# Patient Record
Sex: Female | Born: 1975
Health system: Southern US, Community
[De-identification: ages and names within clinical notes are randomized; demographics above are authoritative.]

## PROBLEM LIST (undated history)

## (undated) ENCOUNTER — Ambulatory Visit: Admission: RE | Payer: 59 | Source: Ambulatory Visit

## (undated) DIAGNOSIS — D649 Anemia, unspecified: Secondary | ICD-10-CM

## (undated) DIAGNOSIS — R51 Headache: Secondary | ICD-10-CM

## (undated) DIAGNOSIS — R519 Headache, unspecified: Secondary | ICD-10-CM

## (undated) DIAGNOSIS — I499 Cardiac arrhythmia, unspecified: Secondary | ICD-10-CM

---

## 2005-07-28 ENCOUNTER — Emergency Department: Payer: Self-pay | Admitting: Unknown Physician Specialty

## 2014-09-04 ENCOUNTER — Emergency Department: Payer: Self-pay | Admitting: Emergency Medicine

## 2014-09-04 LAB — CBC
HCT: 42.3 % (ref 35.0–47.0)
HGB: 14.1 g/dL (ref 12.0–16.0)
MCH: 29.1 pg (ref 26.0–34.0)
MCHC: 33.4 g/dL (ref 32.0–36.0)
MCV: 87 fL (ref 80–100)
Platelet: 287 10*3/uL (ref 150–440)
RBC: 4.86 10*6/uL (ref 3.80–5.20)
RDW: 12.9 % (ref 11.5–14.5)
WBC: 11.4 10*3/uL — AB (ref 3.6–11.0)

## 2014-09-04 LAB — HCG, QUANTITATIVE, PREGNANCY: Beta Hcg, Quant.: 41425 m[IU]/mL — ABNORMAL HIGH

## 2014-10-24 ENCOUNTER — Emergency Department: Payer: Self-pay | Admitting: Emergency Medicine

## 2014-11-25 ENCOUNTER — Emergency Department
Admit: 2014-11-25 | Disposition: A | Discharge status: Home / Self Care | Payer: Self-pay | Admitting: Emergency Medicine

## 2014-11-26 LAB — URINALYSIS, COMPLETE
Bilirubin,UR: NEGATIVE
Glucose,UR: NEGATIVE mg/dL (ref 0–75)
Ketone: NEGATIVE
NITRITE: NEGATIVE
Ph: 6 (ref 4.5–8.0)
Protein: 30
SPECIFIC GRAVITY: 1.023 (ref 1.003–1.030)

## 2014-12-16 ENCOUNTER — Other Ambulatory Visit: Payer: Self-pay | Admitting: Obstetrics and Gynecology

## 2014-12-16 DIAGNOSIS — IMO0002 Reserved for concepts with insufficient information to code with codable children: Secondary | ICD-10-CM

## 2014-12-16 DIAGNOSIS — Z0489 Encounter for examination and observation for other specified reasons: Secondary | ICD-10-CM

## 2015-01-05 ENCOUNTER — Ambulatory Visit
Admission: RE | Admit: 2015-01-05 | Discharge: 2015-01-05 | Disposition: A | Discharge status: Home / Self Care | Payer: 59 | Source: Ambulatory Visit | Attending: Obstetrics and Gynecology | Admitting: Obstetrics and Gynecology

## 2015-01-05 ENCOUNTER — Ambulatory Visit: Payer: 59

## 2015-01-05 DIAGNOSIS — Z0489 Encounter for examination and observation for other specified reasons: Secondary | ICD-10-CM

## 2015-01-05 DIAGNOSIS — IMO0002 Reserved for concepts with insufficient information to code with codable children: Secondary | ICD-10-CM

## 2015-01-05 DIAGNOSIS — O288 Other abnormal findings on antenatal screening of mother: Secondary | ICD-10-CM | POA: Diagnosis present

## 2015-01-05 LAB — US OB DETAIL + 14 WK

## 2015-04-03 ENCOUNTER — Observation Stay
Admission: RE | Admit: 2015-04-03 | Discharge: 2015-04-03 | Disposition: A | Discharge status: Home / Self Care | Payer: 59 | Source: Intra-hospital | Attending: Obstetrics & Gynecology | Admitting: Obstetrics & Gynecology

## 2015-04-03 DIAGNOSIS — Z3A37 37 weeks gestation of pregnancy: Secondary | ICD-10-CM | POA: Diagnosis not present

## 2015-04-03 DIAGNOSIS — O288 Other abnormal findings on antenatal screening of mother: Secondary | ICD-10-CM | POA: Diagnosis present

## 2015-04-03 NOTE — Discharge Instructions (Signed)
Return to ER for decrease in fetal movement, contractions 5 minutes apart for 2 hours, or any leaking fluid, or heavy vaginal bleeding.

## 2015-04-03 NOTE — Discharge Summary (Signed)
Obstetric History and Physical  Dawn Terry is a 39 y.o. G2P1 with Estimated Date of Delivery: 04/21/15 per 8 wk [place] who presents at [redacted]w[redacted]d  presenting for prolonged monitoring after NR NST in office today with decels. AFI 8 cm in office today.  Patient states she has been having rare contractions, no vaginal bleeding, intact membranes, with active fetal movement.    Prenatal Course Source of Care: [facility]  with onset of care at 7 weeks Pregnancy complications or risks: BMI >40, AMA with negative Informaseq, elevated 1 hr with normal 3 hr at 28 wks, Rh neg (rhogam given 01/30/15), fetal renal pyelectasis resolved on f/u   Prenatal labs and studies: ABO, Rh: A neg  Antibody: neg Rubella: Immune Varicella: Immune RPR:  Non-reactive HBsAg:  neg HIV: neg GC/CT: neg/neg GBS: neg  1 hr Glucola: 150 at 28 wks, 3 hr with only 1 elevated value   Genetic screening: Negative Informaseq XY    Prenatal Transfer Tool   PMHx: obesity, high cholesterol  No past surgical history on file.  OB History  Gravida Para Term Preterm AB SAB TAB Ectopic Multiple Living  2 1            # Outcome Date GA Lbr Len/2nd Weight Sex Delivery Anes PTL Lv  2 Current           1 Para 05/1995              Social History   Social History  . Marital Status: Married    Spouse Name: N/A  . Number of Children: N/A  . Years of Education: N/A   Social History Main Topics  . Smoking status: Not on file  . Smokeless tobacco: Not on file  . Alcohol Use: Not on file  . Drug Use: Not on file  . Sexual Activity: Not on file   Other Topics Concern  . Not on file   Social History Narrative  . No narrative on file    No family history on file.  Prescriptions prior to admission  Medication Sig Dispense Refill Last Dose   Zyrtec - prn      . Prenatal Vit-Fe Fumarate-FA (PRENATAL MULTIVITAMIN) TABS tablet Take 1 tablet by mouth daily at 12 noon.   Taking    No Known Allergies  Review of Systems:  Negative except for what is mentioned in HPI.  Physical Exam: There were no vitals taken for this visit. GENERAL: Well-developed, well-nourished female in no acute distress.  ABDOMEN: Soft, nontender, nondistended, gravid. EXTREMITIES: Nontender, no edema Cervical Exam: Dilatation FT cm   Effacement 75 %   Station -2 to -3   Presentation: cephalic FHT: Category: 1 Baseline rate 130 bpm   Variability moderate  Accelerations present   Decelerations variable - rare Contractions: irregular    Pertinent Labs/Studies:   No results found for this or any previous visit (from the past 24 hour(s)).  Assessment : IUP at [redacted]w[redacted]d with category 1 FHR tracing  Plan: Discharge Home  Labor precautions F/u in office next week for AFI and NST for BMI >40 protocol

## 2015-04-28 ENCOUNTER — Inpatient Hospital Stay
Admission: EM | Admit: 2015-04-28 | Discharge: 2015-05-02 | DRG: 765 | Disposition: A | Discharge status: Home / Self Care | Payer: 59 | Attending: Obstetrics and Gynecology | Admitting: Obstetrics and Gynecology

## 2015-04-28 ENCOUNTER — Encounter: Payer: Self-pay | Admitting: *Deleted

## 2015-04-28 DIAGNOSIS — O480 Post-term pregnancy: Secondary | ICD-10-CM | POA: Diagnosis present

## 2015-04-28 DIAGNOSIS — O99214 Obesity complicating childbirth: Secondary | ICD-10-CM | POA: Diagnosis present

## 2015-04-28 DIAGNOSIS — Z6841 Body Mass Index (BMI) 40.0 and over, adult: Secondary | ICD-10-CM | POA: Diagnosis not present

## 2015-04-28 DIAGNOSIS — O09523 Supervision of elderly multigravida, third trimester: Secondary | ICD-10-CM | POA: Diagnosis not present

## 2015-04-28 DIAGNOSIS — Z3A41 41 weeks gestation of pregnancy: Secondary | ICD-10-CM | POA: Diagnosis present

## 2015-04-28 DIAGNOSIS — O9902 Anemia complicating childbirth: Secondary | ICD-10-CM | POA: Diagnosis present

## 2015-04-28 LAB — OB RESULTS CONSOLE VARICELLA ZOSTER ANTIBODY, IGG: Varicella: IMMUNE

## 2015-04-28 LAB — OB RESULTS CONSOLE RPR: RPR: NONREACTIVE

## 2015-04-28 LAB — OB RESULTS CONSOLE HEPATITIS B SURFACE ANTIGEN: Hepatitis B Surface Ag: NEGATIVE

## 2015-04-28 LAB — OB RESULTS CONSOLE GBS: GBS: NEGATIVE

## 2015-04-28 LAB — OB RESULTS CONSOLE RUBELLA ANTIBODY, IGM: RUBELLA: IMMUNE

## 2015-04-28 LAB — OB RESULTS CONSOLE ABO/RH: RH TYPE: NEGATIVE

## 2015-04-28 LAB — OB RESULTS CONSOLE HIV ANTIBODY (ROUTINE TESTING): HIV: NONREACTIVE

## 2015-04-28 MED ORDER — LACTATED RINGERS IV SOLN
INTRAVENOUS | Status: DC
  Administered 2015-04-29 (×3): via INTRAVENOUS

## 2015-04-28 MED ORDER — TERBUTALINE SULFATE 1 MG/ML IJ SOLN: 0.2500 mg | Freq: Once | INTRAMUSCULAR | Status: DC | PRN

## 2015-04-28 MED ORDER — OXYTOCIN BOLUS FROM INFUSION: 500.0000 mL | INTRAVENOUS | Status: DC

## 2015-04-28 MED ORDER — OXYTOCIN 40 UNITS IN LACTATED RINGERS INFUSION - SIMPLE MED
62.5000 mL/h | INTRAVENOUS | Status: DC
  Filled 2015-04-28: qty 1000

## 2015-04-28 MED ORDER — ACETAMINOPHEN 325 MG PO TABS: 650.0000 mg | ORAL_TABLET | ORAL | Status: DC | PRN

## 2015-04-28 MED ORDER — LACTATED RINGERS IV SOLN: 500.0000 mL | INTRAVENOUS | Status: DC | PRN

## 2015-04-28 MED ORDER — CITRIC ACID-SODIUM CITRATE 334-500 MG/5ML PO SOLN
30.0000 mL | ORAL | Status: DC | PRN
  Filled 2015-04-28: qty 30

## 2015-04-28 MED ORDER — LIDOCAINE HCL (PF) 1 % IJ SOLN
30.0000 mL | INTRAMUSCULAR | Status: DC | PRN
  Filled 2015-04-28: qty 30

## 2015-04-28 MED ORDER — DINOPROSTONE 10 MG VA INST
10.0000 mg | VAGINAL_INSERT | Freq: Once | VAGINAL | Status: DC
  Filled 2015-04-28: qty 1

## 2015-04-28 MED ORDER — ONDANSETRON HCL 4 MG/2ML IJ SOLN: 4.0000 mg | Freq: Four times a day (QID) | INTRAMUSCULAR | Status: DC | PRN

## 2015-04-28 MED ORDER — ZOLPIDEM TARTRATE 5 MG PO TABS: 5.0000 mg | ORAL_TABLET | Freq: Every evening | ORAL | Status: DC | PRN

## 2015-04-28 MED ORDER — FENTANYL CITRATE (PF) 100 MCG/2ML IJ SOLN: 50.0000 ug | INTRAMUSCULAR | Status: DC | PRN

## 2015-04-29 ENCOUNTER — Inpatient Hospital Stay: Payer: 59 | Admitting: Anesthesiology

## 2015-04-29 ENCOUNTER — Encounter
Admission: EM | Disposition: A | Discharge status: Home / Self Care | Payer: Self-pay | Source: Home / Self Care | Attending: Obstetrics and Gynecology

## 2015-04-29 LAB — CBC
HCT: 30.7 % — ABNORMAL LOW (ref 35.0–47.0)
HEMATOCRIT: 36.1 % (ref 35.0–47.0)
HEMOGLOBIN: 10.2 g/dL — AB (ref 12.0–16.0)
HEMOGLOBIN: 12 g/dL (ref 12.0–16.0)
MCH: 27.9 pg (ref 26.0–34.0)
MCH: 28.1 pg (ref 26.0–34.0)
MCHC: 33 g/dL (ref 32.0–36.0)
MCHC: 33.2 g/dL (ref 32.0–36.0)
MCV: 85 fL (ref 80.0–100.0)
MCV: 83.9 fL (ref 80.0–100.0)
Platelets: 229 10*3/uL (ref 150–440)
Platelets: 270 10*3/uL (ref 150–440)
RBC: 3.62 MIL/uL — ABNORMAL LOW (ref 3.80–5.20)
RBC: 4.31 MIL/uL (ref 3.80–5.20)
RDW: 13.4 % (ref 11.5–14.5)
RDW: 13.5 % (ref 11.5–14.5)
WBC: 11.6 10*3/uL — ABNORMAL HIGH (ref 3.6–11.0)
WBC: 18.3 10*3/uL — ABNORMAL HIGH (ref 3.6–11.0)

## 2015-04-29 LAB — ABO/RH: ABO/RH(D): A NEG

## 2015-04-29 LAB — PREPARE RBC (CROSSMATCH)

## 2015-04-29 SURGERY — Surgical Case
Anesthesia: Spinal

## 2015-04-29 MED ORDER — ONDANSETRON HCL 4 MG/2ML IJ SOLN: 4.0000 mg | Freq: Three times a day (TID) | INTRAMUSCULAR | Status: DC | PRN

## 2015-04-29 MED ORDER — NALBUPHINE HCL 10 MG/ML IJ SOLN
5.0000 mg | Freq: Once | INTRAMUSCULAR | Status: DC | PRN
  Filled 2015-04-29: qty 0.5

## 2015-04-29 MED ORDER — FENTANYL CITRATE (PF) 100 MCG/2ML IJ SOLN: INTRAMUSCULAR | Status: DC | PRN

## 2015-04-29 MED ORDER — CEFAZOLIN SODIUM-DEXTROSE 2-3 GM-% IV SOLR
INTRAVENOUS | Status: AC
  Administered 2015-04-29: 2 g via INTRAVENOUS
  Administered 2015-04-29: 1 g via INTRAVENOUS
  Filled 2015-04-29: qty 50

## 2015-04-29 MED ORDER — PRENATAL MULTIVITAMIN CH
1.0000 | ORAL_TABLET | Freq: Every day | ORAL | Status: DC
  Administered 2015-04-30 – 2015-05-02 (×3): 1 via ORAL
  Filled 2015-04-29 (×3): qty 1

## 2015-04-29 MED ORDER — IBUPROFEN 600 MG PO TABS: 600.0000 mg | ORAL_TABLET | Freq: Four times a day (QID) | ORAL | Status: DC | PRN

## 2015-04-29 MED ORDER — DEXTROSE 5 % IV SOLN
3.0000 g | INTRAVENOUS | Status: DC
  Filled 2015-04-29: qty 3000

## 2015-04-29 MED ORDER — SODIUM CHLORIDE 0.9 % IJ SOLN
INTRAMUSCULAR | Status: AC
  Filled 2015-04-29: qty 6

## 2015-04-29 MED ORDER — MEPERIDINE HCL 25 MG/ML IJ SOLN: 6.2500 mg | INTRAMUSCULAR | Status: DC | PRN

## 2015-04-29 MED ORDER — NALOXONE HCL 0.4 MG/ML IJ SOLN: 0.4000 mg | INTRAMUSCULAR | Status: DC | PRN

## 2015-04-29 MED ORDER — SIMETHICONE 80 MG PO CHEW
80.0000 mg | CHEWABLE_TABLET | Freq: Three times a day (TID) | ORAL | Status: DC
  Administered 2015-04-30 – 2015-05-02 (×6): 80 mg via ORAL
  Filled 2015-04-29 (×6): qty 1

## 2015-04-29 MED ORDER — BUPIVACAINE HCL (PF) 0.75 % IJ SOLN
INTRAMUSCULAR | Status: DC | PRN
  Administered 2015-04-29: 1.5 mL via INTRATHECAL

## 2015-04-29 MED ORDER — BUPIVACAINE HCL (PF) 0.5 % IJ SOLN
INTRAMUSCULAR | Status: AC
  Filled 2015-04-29: qty 30

## 2015-04-29 MED ORDER — OXYTOCIN 40 UNITS IN LACTATED RINGERS INFUSION - SIMPLE MED
INTRAVENOUS | Status: AC
  Administered 2015-04-29: 500 mL via INTRAVENOUS
  Filled 2015-04-29: qty 1000

## 2015-04-29 MED ORDER — SODIUM CHLORIDE 0.9 % IV SOLN: Freq: Once | INTRAVENOUS | Status: DC

## 2015-04-29 MED ORDER — LACTATED RINGERS IV SOLN
INTRAVENOUS | Status: DC | PRN
  Administered 2015-04-29 (×2): via INTRAVENOUS

## 2015-04-29 MED ORDER — SODIUM CHLORIDE 0.9 % IJ SOLN: 3.0000 mL | INTRAMUSCULAR | Status: DC | PRN

## 2015-04-29 MED ORDER — FENTANYL CITRATE (PF) 100 MCG/2ML IJ SOLN: 25.0000 ug | INTRAMUSCULAR | Status: DC | PRN

## 2015-04-29 MED ORDER — CITRIC ACID-SODIUM CITRATE 334-500 MG/5ML PO SOLN
30.0000 mL | ORAL | Status: AC
  Administered 2015-04-29: 30 mL via ORAL

## 2015-04-29 MED ORDER — DIPHENHYDRAMINE HCL 25 MG PO CAPS: 25.0000 mg | ORAL_CAPSULE | ORAL | Status: DC | PRN

## 2015-04-29 MED ORDER — FERROUS SULFATE 325 (65 FE) MG PO TABS
325.0000 mg | ORAL_TABLET | Freq: Two times a day (BID) | ORAL | Status: DC
  Administered 2015-04-30 – 2015-05-02 (×5): 325 mg via ORAL
  Filled 2015-04-29 (×5): qty 1

## 2015-04-29 MED ORDER — NALOXONE HCL 1 MG/ML IJ SOLN
1.0000 ug/kg/h | INTRAVENOUS | Status: DC | PRN
  Filled 2015-04-29: qty 2

## 2015-04-29 MED ORDER — LACTATED RINGERS IV SOLN
INTRAVENOUS | Status: DC
  Administered 2015-04-30 (×3): via INTRAVENOUS

## 2015-04-29 MED ORDER — LANOLIN HYDROUS EX OINT: 1.0000 "application " | TOPICAL_OINTMENT | CUTANEOUS | Status: DC | PRN

## 2015-04-29 MED ORDER — OXYTOCIN 40 UNITS IN LACTATED RINGERS INFUSION - SIMPLE MED: 62.5000 mL/h | INTRAVENOUS | Status: AC

## 2015-04-29 MED ORDER — TERBUTALINE SULFATE 1 MG/ML IJ SOLN: 0.2500 mg | Freq: Once | INTRAMUSCULAR | Status: DC | PRN

## 2015-04-29 MED ORDER — OXYTOCIN 40 UNITS IN LACTATED RINGERS INFUSION - SIMPLE MED
62.5000 mL/h | INTRAVENOUS | Status: DC
  Administered 2015-04-29: 1.5 mL/h via INTRAVENOUS

## 2015-04-29 MED ORDER — BUPIVACAINE ON-Q PAIN PUMP (FOR ORDER SET NO CHG)
INJECTION | Status: DC
Start: 2015-04-29 — End: 2015-05-02
  Filled 2015-04-29: qty 1

## 2015-04-29 MED ORDER — CEFAZOLIN SODIUM 1-5 GM-% IV SOLN
INTRAVENOUS | Status: AC
Start: 2015-04-29 — End: 2015-04-30
  Filled 2015-04-29: qty 50

## 2015-04-29 MED ORDER — DIPHENHYDRAMINE HCL 25 MG PO CAPS: 25.0000 mg | ORAL_CAPSULE | Freq: Four times a day (QID) | ORAL | Status: DC | PRN

## 2015-04-29 MED ORDER — BUPIVACAINE HCL 0.5 % IJ SOLN: 5.0000 mL | Freq: Once | INTRAMUSCULAR | Status: DC

## 2015-04-29 MED ORDER — MENTHOL 3 MG MT LOZG
1.0000 | LOZENGE | OROMUCOSAL | Status: DC | PRN
  Filled 2015-04-29: qty 9

## 2015-04-29 MED ORDER — EPHEDRINE SULFATE 50 MG/ML IJ SOLN
INTRAMUSCULAR | Status: DC | PRN
  Administered 2015-04-29 (×9): 10 mg via INTRAVENOUS

## 2015-04-29 MED ORDER — NALBUPHINE HCL 10 MG/ML IJ SOLN
5.0000 mg | INTRAMUSCULAR | Status: DC | PRN
  Filled 2015-04-29: qty 0.5

## 2015-04-29 MED ORDER — BUPIVACAINE 0.25 % ON-Q PUMP DUAL CATH 400 ML
400.0000 mL | INJECTION | Status: DC
  Filled 2015-04-29: qty 400

## 2015-04-29 MED ORDER — SCOPOLAMINE 1 MG/3DAYS TD PT72
1.0000 | MEDICATED_PATCH | Freq: Once | TRANSDERMAL | Status: DC
  Filled 2015-04-29: qty 1

## 2015-04-29 MED ORDER — WITCH HAZEL-GLYCERIN EX PADS: 1.0000 "application " | MEDICATED_PAD | CUTANEOUS | Status: DC | PRN

## 2015-04-29 MED ORDER — SENNOSIDES-DOCUSATE SODIUM 8.6-50 MG PO TABS
2.0000 | ORAL_TABLET | ORAL | Status: DC
Start: 2015-04-30 — End: 2015-05-02
  Administered 2015-04-30 – 2015-05-01 (×2): 2 via ORAL
  Filled 2015-04-29 (×2): qty 2

## 2015-04-29 MED ORDER — ONDANSETRON HCL 4 MG/2ML IJ SOLN
4.0000 mg | Freq: Once | INTRAMUSCULAR | Status: AC | PRN
  Administered 2015-04-29: 4 mg via INTRAVENOUS

## 2015-04-29 MED ORDER — DIPHENHYDRAMINE HCL 50 MG/ML IJ SOLN: 12.5000 mg | INTRAMUSCULAR | Status: DC | PRN

## 2015-04-29 MED ORDER — DIBUCAINE 1 % RE OINT: 1.0000 "application " | TOPICAL_OINTMENT | RECTAL | Status: DC | PRN

## 2015-04-29 MED ORDER — SODIUM CHLORIDE 0.9 % IV SOLN
10.0000 mL/h | Freq: Once | INTRAVENOUS | Status: AC
  Administered 2015-04-29: 17:00:00 via INTRAVENOUS

## 2015-04-29 SURGICAL SUPPLY — 27 items
CANISTER SUCT 3000ML (MISCELLANEOUS) ×3 IMPLANT
CATH KIT ON-Q SILVERSOAK 5IN (CATHETERS) ×6 IMPLANT
CLOSURE WOUND 1/2 X4 (GAUZE/BANDAGES/DRESSINGS) ×1
DRSG TELFA 3X8 NADH (GAUZE/BANDAGES/DRESSINGS) ×3 IMPLANT
ELECT CAUTERY BLADE 6.4 (BLADE) ×3 IMPLANT
GAUZE SPONGE 4X4 12PLY STRL (GAUZE/BANDAGES/DRESSINGS) ×3 IMPLANT
GLOVE BIO SURGEON STRL SZ7 (GLOVE) ×3 IMPLANT
GLOVE INDICATOR 7.5 STRL GRN (GLOVE) ×3 IMPLANT
GOWN STRL REUS W/ TWL LRG LVL3 (GOWN DISPOSABLE) ×3 IMPLANT
GOWN STRL REUS W/TWL LRG LVL3 (GOWN DISPOSABLE) ×6
LIQUID BAND (GAUZE/BANDAGES/DRESSINGS) ×3 IMPLANT
NS IRRIG 1000ML POUR BTL (IV SOLUTION) ×3 IMPLANT
PACK C SECTION AR (MISCELLANEOUS) ×3 IMPLANT
PAD GROUND ADULT SPLIT (MISCELLANEOUS) ×3 IMPLANT
PAD OB MATERNITY 4.3X12.25 (PERSONAL CARE ITEMS) ×6 IMPLANT
PAD PREP 24X41 OB/GYN DISP (PERSONAL CARE ITEMS) ×3 IMPLANT
SPONGE LAP 18X18 5 PK (GAUZE/BANDAGES/DRESSINGS) ×6 IMPLANT
STRIP CLOSURE SKIN 1/2X4 (GAUZE/BANDAGES/DRESSINGS) ×2 IMPLANT
SUT CHROMIC GUT BROWN 0 54 (SUTURE) ×1 IMPLANT
SUT CHROMIC GUT BROWN 0 54IN (SUTURE) ×3
SUT MNCRL 4-0 (SUTURE) ×2
SUT MNCRL 4-0 27XMFL (SUTURE) ×1
SUT PDS AB 1 TP1 96 (SUTURE) ×3 IMPLANT
SUT PLAIN 2 0 XLH (SUTURE) ×3 IMPLANT
SUT VIC AB 0 CT1 36 (SUTURE) ×12 IMPLANT
SUTURE MNCRL 4-0 27XMF (SUTURE) ×1 IMPLANT
SWABSTK COMLB BENZOIN TINCTURE (MISCELLANEOUS) ×3 IMPLANT

## 2015-04-29 NOTE — Discharge Summary (Signed)
Obstetrical Discharge Summary  Date of Admission: 04/28/2015 Date of Discharge: 05/02/2015  Primary OB: [facility]  Gestational Age at Delivery: [redacted]w[redacted]d   Antepartum complications: Advanced Maternal Age, Morbid obesity with BMI 43, Rh negative status, bilateral renal pelviectasis (normal at 28 weeks scan) Reason for Admission: postdates induction of labor Date of Delivery:  04/29/2015  Delivered By: Thomasene Mohair, MD Delivery Type: primary cesarean section, low transverse incision Intrapartum complications/course: Patient with episodic decels with minimal-to-no intervention. The patient was contracting spontaneously 3-4 times per minute. After third episodic decel (that lasted for 8 minutes).  Anesthesia: spinal  Placenta: sponatneous Laceration: n/a Episiotomy: none Newborn Data: Live born female  Birth Weight: 8 lb 8.2 oz (3861 g) APGAR: 8, 9  Discharge Physical Exam:  BP 110/60 mmHg  Pulse 81  Temp(Src) 97.9 F (36.6 C) (Oral)  Resp 20  Ht  (1.6 m)  Wt 235 lb (106.595 kg)  BMI 41.64 kg/m2  SpO2 98%  Breastfeeding? Unknown  General: NAD CV: RRR Pulm: CTABL, nl effort ABD: obese, soft, nttp, nd, +BS Lochia: moderate Incision: c/d/i with steri strips in place and on q in place DVT Evaluation: LE non-ttp, no evidence of DVT on exam.  HEMOGLOBIN  Date Value Ref Range Status  04/30/2015 9.3* 12.0 - 16.0 g/dL Final   HGB  Date Value Ref Range Status  09/04/2014 14.1 12.0-16.0 g/dL Final   HCT  Date Value Ref Range Status  04/30/2015 27.6* 35.0 - 47.0 % Final  09/04/2014 42.3 35.0-47.0 % Final    Post partum course: uncomplicated Postpartum Procedures: transfusion 1U PRBCs Disposition: stable, discharge to home.  Rh Immune globulin given: not applicable (infant is Rh negative, too) Rubella vaccine given: n/a Tdap vaccine given in AP or PP setting: 02/27/15 Flu vaccine given in AP or PP setting: no  Contraception: not d/w pt  Prenatal Labs:  A NEG /  Rubella Immune / Varicella Immune/  RPR negative / HIV negative / HepBsAg negative / Tdap UTD: Yes/pap 2014 neg / Breast  / Contraception: not d/w pt / Follow up: [facility], 1wk    Plan:  Dawn Terry was discharged to home in good condition. Follow-up appointment at [facility] with Dr Jean Rosenthal in 1 week for incision check   Discharge Medications:   Medication List    STOP taking these medications        aspirin 81 MG tablet      TAKE these medications        docusate sodium 100 MG capsule  Commonly known as:  COLACE  Take 1 capsule (100 mg total) by mouth daily.     ferrous sulfate 325 (65 FE) MG tablet  Take 1 tablet (325 mg total) by mouth daily with breakfast.     HYDROcodone-acetaminophen 5-325 MG per tablet  Commonly known as:  NORCO/VICODIN  Take 1-2 tablets by mouth every 6 (six) hours as needed for severe pain.     ibuprofen 600 MG tablet  Commonly known as:  ADVIL,MOTRIN  Take 1 tablet (600 mg total) by mouth every 6 (six) hours as needed.     prenatal multivitamin Tabs tablet  Take 1 tablet by mouth daily at 12 noon.         Signed: Cornelia Copa MD [facility]  Pager: [phone]

## 2015-04-29 NOTE — Progress Notes (Addendum)
OB note CTSP regarding prolonged decel.  39 y/o G2P1001 @ 41/1 here for PDIOL. Preg c/b BMI 43, AMA, Rh negative  No induction methods started yet and had episodic decel to the 60s for 3-35m at 2300 and now most recently to the 60s for 5-41m (episodic) and she is contracting irregularly and no concurrence with contraction; recovery with lateral positioning and O2 and IVFs going.  pt not endorsing any UCs. Currently 115 baseline, +accels, no decels, moderate variability and toco quiet Filed Vitals:   04/28/15 2226  BP: 119/75  Pulse: 68  Temp: 98.4 F (36.9 C)  TempSrc: Oral  Resp: 18  Weight: 235 lb (106.595 kg)   8/22: 3261gm, EFW 73%, AC>97% 9/19: cephalic, AFI 7.8  Will allow fetus to recover for 1.5 hours. If EFM reassuring, then can do CST. Admit labs pending  Cornelia Copa MD [facility]  Pager: [phone]

## 2015-04-29 NOTE — Plan of Care (Signed)
Problem: Consults Goal: Birthing Suites Patient Information Press F2 to bring up selections list   Pt > [redacted] weeks EGA and Inpatient induction  Problem: Phase I Progression Outcomes Goal: Adequate progression of labor Outcome: Not Met (add Reason) Fetal intolerance of labor

## 2015-04-29 NOTE — Anesthesia Preprocedure Evaluation (Addendum)
Anesthesia Evaluation  Patient identified by MRN, date of birth, ID band Patient awake    Reviewed: Allergy & Precautions, NPO status , Patient's Chart, lab work & pertinent test results  History of Anesthesia Complications Negative for: history of anesthetic complications  Airway Mallampati: III       Dental  (+) Teeth Intact   Pulmonary neg pulmonary ROS,           Cardiovascular negative cardio ROS       Neuro/Psych negative neurological ROS     GI/Hepatic negative GI ROS, Neg liver ROS,   Endo/Other  negative endocrine ROS  Renal/GU negative Renal ROS     Musculoskeletal   Abdominal   Peds  Hematology negative hematology ROS (+)   Anesthesia Other Findings   Reproductive/Obstetrics (+) Pregnancy                             Anesthesia Physical Anesthesia Plan  ASA: II and emergent  Anesthesia Plan: Spinal   Post-op Pain Management: MAC Combined w/ Regional for Post-op pain   Induction:   Airway Management Planned:   Additional Equipment:   Intra-op Plan:   Post-operative Plan:   Informed Consent: I have reviewed the patients History and Physical, chart, labs and discussed the procedure including the risks, benefits and alternatives for the proposed anesthesia with the patient or authorized representative who has indicated his/her understanding and acceptance.     Plan Discussed with:   Anesthesia Plan Comments:        Anesthesia Quick Evaluation

## 2015-04-29 NOTE — Progress Notes (Signed)
Orders placed for start of pitocin while foley bulb still in place.

## 2015-04-29 NOTE — Progress Notes (Signed)
Called to see patient due to 8 minute fetal heart rate deceleration.  This resolved by discontinuation of iv pitocin (dose was 1), and maternal position changes.  Fetal heart rate now back to baseline with moderate variability and accels.   Immediately preceding this episode was an episode of tachysystole.  This has been an intermittent phenomenon.  This is the second prolonged deceleration since admission.  The first occurred without provocation.  The patient had been on pitocin for this episode for about 25 minutes.   Discussed options of proceeding with delivery via cesarean section given that patient is quite remote from delivery and with minimal provocation had this type of deceleration.  We discussed proceeding with induction of labor with understanding of risk of needing emergent/urgent surgery if further decelerations occur. Patient and husband has risks of cesarean section discussed at length.  They have discussed this and wish to proceed with cesarean delivery.  Conard Novak, MD, FACOG 04/29/2015 3:03 PM

## 2015-04-29 NOTE — Anesthesia Procedure Notes (Addendum)
Spinal Patient location during procedure: OR Start time: 04/29/2015 3:32 PM End time: 04/29/2015 3:47 PM Staffing Performed by: anesthesiologist  Preanesthetic Checklist Completed: patient identified, site marked, surgical consent, pre-op evaluation, timeout performed, IV checked, risks and benefits discussed and monitors and equipment checked Spinal Block Patient position: sitting Prep: Betadine Patient monitoring: heart rate, continuous pulse ox, blood pressure and cardiac monitor Approach: midline Location: L4-5 Injection technique: single-shot Needle Needle type: Whitacre and Introducer  Needle gauge: 27 G Needle length: 9 cm Needle insertion depth: 9 cm Assessment Sensory level: T6 Additional Notes Negative paresthesia. Negative blood return. Positive free-flowing CSF. Expiration date of kit checked and confirmed. Patient tolerated procedure well, without complications.

## 2015-04-29 NOTE — Progress Notes (Signed)
L&D Note  04/29/2015 - 3:11 AM  39 y.o. y/o G2P1001 @ 41/1 here for PDIOL. Preg c/b BMI 43, AMA, Rh negative, ?fetal b/l pelviectasis  Ms. Coree Riester is admitted for PDIOL   Subjective:  Not feeling any UCs  Objective:   Filed Vitals:   04/28/15 2226  BP: 119/75  Pulse: 68  Temp: 98.4 F (36.9 C)  TempSrc: Oral  Resp: 18  Weight: 235 lb (106.595 kg)    Current Vital Signs 24h Vital Sign Ranges  T 98.4 F (36.9 C) Temp  Avg: 98.4 F (36.9 C)  Min: 98.4 F (36.9 C)  Max: 98.4 F (36.9 C)  BP 119/75 mmHg BP  Min: 119/75  Max: 119/75  HR 68 Pulse  Avg: 68  Min: 68  Max: 68  RR 18 Resp  Avg: 18  Min: 18  Max: 18  SaO2    98/RA No Data Recorded       24 Hour I/O Current Shift I/O  Time Ins Outs       FHR: 115 baseline, +accels, occasional slight variables vs maternal artifact Toco: irregular UCs Gen: NAD SVE: 2/70/-1 per RN  BSUS: cephalic. Subjectively low normal AFI, FHR 122   Recent Labs Lab 04/29/15 0136  WBC 11.6*  HGB 12.0  HCT 36.1  PLT 270   Medications Current Facility-Administered Medications  Medication Dose Route Frequency Provider Last Rate Last Dose  . acetaminophen (TYLENOL) tablet 650 mg  650 mg Oral Q4H PRN [place] Bing, MD      . citric acid-sodium citrate (ORACIT) solution 30 mL  30 mL Oral Q2H PRN [place] Bing, MD      . dinoprostone (CERVIDIL) vaginal insert 10 mg  10 mg Vaginal Once [place] Bing, MD      . fentaNYL (SUBLIMAZE) injection 50 mcg  50 mcg Intravenous Q1H PRN [place] Bing, MD      . lactated ringers infusion 500-1,000 mL  500-1,000 mL Intravenous PRN [place] Bing, MD      . lactated ringers infusion   Intravenous Continuous [place] Bing, MD 125 mL/hr at 04/29/15 0304    . lidocaine (PF) (XYLOCAINE) 1 % injection 30 mL  30 mL Subcutaneous PRN [place] Bing, MD      . ondansetron (ZOFRAN) injection 4 mg  4 mg Intravenous Q6H PRN [place] Bing, MD      . oxytocin (PITOCIN) IV BOLUS FROM BAG  500 mL  Intravenous Continuous [place] Bing, MD      . oxytocin (PITOCIN) IV infusion 40 units in LR 1000 mL  62.5 mL/hr Intravenous Continuous [place] Bing, MD      . terbutaline (BRETHINE) injection 0.25 mg  0.25 mg Subcutaneous Once PRN [place] Bing, MD      . zolpidem (AMBIEN) tablet 5 mg  5 mg Oral QHS PRN [place] Bing, MD        Assessment & Plan:  Pt stable *IUP: category I tracing and has had good tracing since decel approx 2.5 hours. *IOL: d/w pt and husband regarding utility of CST and they are amenable to plan. I told them that if fetus passes, then would d/c pitocin and start cervidil ripening. *Elevated AC: SD precautions and keep close eye on labor curve *GBS: neg *Analgesia: no needs. Recommend early epidural given body habitus and EFM history *AMA: s/p neg NIPT *Rh neg: cord blood and PRN PP rhogam  Cornelia Copa MD [facility] Pager [phone]

## 2015-04-29 NOTE — Transfer of Care (Signed)
Immediate Anesthesia Transfer of Care Note  Patient: Dawn Terry  Procedure(s) Performed: Procedure(s): CESAREAN SECTION  Patient Location: PACU/Labor and Delivery 3  Anesthesia Type:Spinal  Level of Consciousness: awake, alert  and oriented  Airway & Oxygen Therapy: Patient Spontanous Breathing  Post-op Assessment: Report given to RN and Post -op Vital signs reviewed and stable  Post vital signs: Reviewed and stable  Last Vitals:  Filed Vitals:   04/29/15 1317  BP: 113/71  Pulse: 72  Temp:   Resp: 20    Complications: No apparent anesthesia complications

## 2015-04-29 NOTE — Progress Notes (Signed)
Patient ID: Dawn Terry, female   DOB: 04-Oct-1975, 39 y.o.   MRN: [id number] L&D Note    Subjective:  Feels cramping and pressure with contractions  Objective:   Filed Vitals:   04/28/15 2226 04/29/15 0332 04/29/15 0727 04/29/15 0735  BP: 119/75 121/71  117/78  Pulse: 68 54  61  Temp: 98.4 F (36.9 C) 98.1 F (36.7 C)  97.9 F (36.6 C)  TempSrc: Oral Oral  Axillary  Resp: Height:    (1.6 m)   Weight: 235 lb (106.595 kg)       Current Vital Signs 24h Vital Sign Ranges  T 97.9 F (36.6 C) Temp  Avg: 98.1 F (36.7 C)  Min: 97.9 F (36.6 C)  Max: 98.4 F (36.9 C)  BP 117/78 mmHg BP  Min: 117/78  Max: 121/71  HR 61 Pulse  Avg: 61  Min: 54  Max: 68  RR 20 Resp  Avg: 18.7  Min: 18  Max: 20  SaO2     No Data Recorded      Gen: NAD FHR: 120/mod var/+accels/occasional variable deceleration. She did have a deceleration for 4 minutes to the 70s-80s with spontaneous return to baseline. Toco: Irregular, 5 q 10 min SVE: small-moderate amount of dark red blood on perineum and glove after exam.  Cvx 2/50/-2,  After explaining rationale for continued and active cervical ripening, 16G foley placed into cervix with balloon through internal os.  40mL of sterile water injected into balloon by RN.  Patient tolerated the procedure well.   Medications SCHEDULED MEDICATIONS: None    PRN MEDICATIONS  acetaminophen, citric acid-sodium citrate, fentaNYL (SUBLIMAZE) injection, lactated ringers, lidocaine (PF), ondansetron, terbutaline, zolpidem   Assessment & Plan:  39 y.o. G2P1 at [redacted]w[redacted]d IOL for late term pregnancy  *Labor: foley catheter placed. Will consider starting pitocin if fetus tolerates ctx with foley balloon in place. *Fetal Well-being: Reassuring overall, especially more recently. Discussed decelerations and risk of need for cesarean delivery, if we can not safely get fetus through labor process. *GBS: negative *Analgesia: none currently.  Conard Novak, MD   04/29/2015 9:12 AM  [name] [name]

## 2015-04-29 NOTE — H&P (Signed)
See OB Note  ?b/l fetal renal pelviectasis. Can tell peds at delivery  [place] Bing, Montez Hageman MD [facility]  Pager: [phone]

## 2015-04-29 NOTE — Op Note (Signed)
Cesarean Section Procedure Note   Atira Borello   04/29/2015   Pre-operative Diagnosis: 1) Intrauterine pregnancy at 41w1, 2) fetal intolerence to labor.   Post-operative Diagnosis: 1) Intrauterine pregnancy at 41w1, 2) fetal intolerence to labor.   Procedure: primary low transverse cesarean delivery  Surgeon: Surgeon(s) and Role:    * Conard Novak, MD - Primary   Anesthesia: spinal   Findings:  normal appearing gravid uterus, fallopian tubes, and ovaries   Estimated Blood Loss: 2,000 mL  Total IV Fluids: 2,100 ml crystalloid and 300 mL packed red blood cells (1 unit intraoperatively)  Urine Output: 50 mL  Specimens: None  Complications: intrapartum hemorrhage of about 1,500 mL due to traversing placenta to get to fetus  Disposition: PACU - hemodynamically stable.   Maternal Condition: stable   Baby condition / location:  Couplet care / Skin to Skin  Procedure Details:  The patient was seen in the Holding Room. The risks, benefits, complications, treatment options, and expected outcomes were discussed with the patient. The patient concurred with the proposed plan, giving informed consent. identified as Dawn Terry and the procedure verified as C-Section Delivery. A Time Out was held and the above information confirmed.   After induction of anesthesia, the patient was draped and prepped in the usual sterile manner. A Pfannenstiel incision was made and carried down through the subcutaneous tissue to the fascia. Fascial incision was made and extended transversely. The fascia was separated from the underlying rectus tissue superiorly and inferiorly. The peritoneum was identified and entered. Peritoneal incision was extended longitudinally. The bladder flap was bluntly freed from the lower uterine segment. A low transverse uterine incision was made and there was difficulty getting through or around the placenta.  Within about 45 seconds there was 1,500 mL of blood  in the collection container (no amniotomy at this point).  A clamp was placed at the site of the blood loss and finally I was able to get around the placenta and perform an amniotomy.  At this point the hysterotomy was extended with cranial-caudal tension. Delivered from cephalic presentation was a 3,861 gram Living newborn infant(s) with Apgar scores of 8 at one minute and 9 at five minutes. Cord ph was sent the umbilical cord was clamped and cut cord blood was obtained for evaluation. The placenta was removed Intact and appeared normal. The uterine outline, tubes and ovaries appeared normal}. The uterine incision was closed with running locked sutures of 0 Vicryl.  A second layer of the same suture was thrown in an imbricating fashion.  Hemostasis was assured.  The uterus was retained to the abdomen and the paracolic gutters were cleared of all clots and debris.  The peritoneum was reapproximated with 0 vicryl in a running fashion.  The rectus muscles were inspected and found to be hemostatic.    The On-Q catheter pumps were inserted in accordance with the manufacturer's recommendations.  The catheters were inserted approximately 4cm cephelad to the incision line, approximately 1cm apart, straddling the midline.  They were inserted to a depth of the 4th mark. They were positioned superficial to the rectus abdominus muscles and deep to the rectus fascia.    The fascia was then reapproximated with running sutures of 1-0 PDS, looped. The subcutaneous tissue was reapproximated using 2-0 plain gut such that no greater than 2cm of dead space remained. The subcuticular closure was performed using 4-0 monocryl. The skin closure was reinforced using benzoin and 1/2" steri-strips.  The On-Q  catheters were bolused with 5 mL of 0.5% marcaine plain for a total of 10 mL (in the PACU).  The catheters were affixed to the skin with surgical skin glue, steri-strips, and tegaderm.    Instrument, sponge, and needle counts  were correct prior the abdominal closure and were correct at the conclusion of the case.  The patient received Ancef 3 gram IV prior to skin incision (within 30 minutes). For VTE prophylaxis she was wearing SCDs throughout the case.  Of note, the patient received one unit of packed red cells during the case.  The reasoning for the transfusion was relayed to the patient and her husband during the episode, who agreed.  Cord blood gases were obtained due to the large amount of placental blood loss prior to delivery.  The pH was 7.19.  The neonatologist was present and received the infant immediately after delivery.  Signed: Conard Novak, MD, FACOG 04/29/2015 5:22 PM

## 2015-04-30 LAB — CBC
HEMATOCRIT: 27.6 % — AB (ref 35.0–47.0)
Hemoglobin: 9.3 g/dL — ABNORMAL LOW (ref 12.0–16.0)
MCH: 28.5 pg (ref 26.0–34.0)
MCHC: 33.5 g/dL (ref 32.0–36.0)
MCV: 84.9 fL (ref 80.0–100.0)
Platelets: 217 10*3/uL (ref 150–440)
RBC: 3.25 MIL/uL — ABNORMAL LOW (ref 3.80–5.20)
RDW: 13.4 % (ref 11.5–14.5)
WBC: 16.6 10*3/uL — ABNORMAL HIGH (ref 3.6–11.0)

## 2015-04-30 LAB — RPR: RPR: NONREACTIVE

## 2015-04-30 MED ORDER — HYDROCODONE-ACETAMINOPHEN 5-325 MG PO TABS
1.0000 | ORAL_TABLET | ORAL | Status: DC | PRN
  Administered 2015-04-30 (×3): 2 via ORAL
  Administered 2015-05-01: 1 via ORAL
  Administered 2015-05-01: 2 via ORAL
  Administered 2015-05-01 – 2015-05-02 (×2): 1 via ORAL
  Filled 2015-04-30: qty 2
  Filled 2015-04-30: qty 1
  Filled 2015-04-30 (×2): qty 2
  Filled 2015-04-30: qty 1
  Filled 2015-04-30: qty 2
  Filled 2015-04-30: qty 1

## 2015-04-30 MED ORDER — ONDANSETRON HCL 4 MG/2ML IJ SOLN
4.0000 mg | Freq: Four times a day (QID) | INTRAMUSCULAR | Status: DC | PRN
  Administered 2015-04-30: 4 mg via INTRAVENOUS
  Filled 2015-04-30: qty 2

## 2015-04-30 NOTE — Progress Notes (Signed)
Patient ID: Dawn Terry, female   DOB: September 22, 1975, 39 y.o.   MRN: [id number] Obstetric Postpartum Daily Progress Note Subjective:  39 y.o. G2P1 is postpartum/postop day #1 status post primary low transverse cesarean delivery due to fetal intolerance to labor.  She is not ambulating, is tolerating po, is not voiding spontaneously (catheter still in place).  Her pain is well controlled on PO pain medications. Her lochia is less than menses.   Medications SCHEDULED MEDICATIONS  . ferrous sulfate  325 mg Oral BID WC  . prenatal multivitamin  1 tablet Oral Q1200  . senna-docusate  2 tablet Oral Q24H  . simethicone  80 mg Oral TID PC    MEDICATION INFUSIONS  . bupivacaine 0.25 % ON-Q pump DUAL CATH 400 mL    . bupivacaine ON-Q pain pump    . lactated ringers 125 mL/hr at 04/30/15 0927  . oxytocin 40 units in LR 1000 mL      PRN MEDICATIONS  witch hazel-glycerin **AND** dibucaine, diphenhydrAMINE, lanolin, menthol-cetylpyridinium, meperidine (DEMEROL) injection, ondansetron (ZOFRAN) IV    Objective:   Filed Vitals:   04/29/15 2310 04/30/15 0003 04/30/15 0407 04/30/15 0732  BP: 105/52 102/57 100/54 106/58  Pulse: 69 63 82 85  Temp: 98.2 F (36.8 C) 98.5 F (36.9 C) 98.9 F (37.2 C) 98.2 F (36.8 C)  TempSrc: Oral Oral Oral Oral  Resp: Height:      Weight:      SpO2: 97% 97% 99% 97%    Current Vital Signs 24h Vital Sign Ranges  T 98.2 F (36.8 C) Temp  Avg: 98.2 F (36.8 C)  Min: 97.6 F (36.4 C)  Max: 98.9 F (37.2 C)  BP (!) 106/58 mmHg BP  Min: 100/54  Max: 121/63  HR 85 Pulse  Avg: 72.5  Min: 47  Max: 170  RR 18 Resp  Avg: 18.9  Min: 18  Max: 20  SaO2 97 % Not Delivered SpO2  Avg: 98.1 %  Min: 96 %  Max: 100 %       24 Hour I/O Current Shift I/O  Time Ins Outs 09/24 0701 - 09/25 0700 In: 3753 [I.V.:3453] Out: 3635 [Urine:785] 09/25 0701 - 09/25 1900 In: -  Out: 175 [Urine:175]  General: NAD Pulmonary: no increased work of breathing Abdomen:  non-distended, non-tender, fundus firm at level of umbilicus Incision: bandage in place, appears dry Extremities: no edema, no erythema, no tenderness  Labs:   Recent Labs Lab 04/29/15 0136 04/29/15 2333 04/30/15 0512  WBC 11.6* 18.3* 16.6*  HGB 12.0 10.2* 9.3*  HCT 36.1 30.7* 27.6*  PLT 270 229 217     Assessment:   39 y.o. G2P1 postpartum/post-op day # 1 s/p primary LTCS for fetal intolerance of labor  Plan:   1) Acute blood loss anemia - hemodynamically stable and asymptomatic, s/p 1 unit pRBCs intraoperatively - po ferrous sulfate  2) A NEG / Rubella Immune (09/23 0000)/ Varicella Immune  3) TDAP status received 02/27/15  4) breast /Contraception = vasectomy versus tubal ligation  5) Disposition: home postop day #3.  Conard Novak, MD, FACOG 04/30/2015 10:32 AM

## 2015-05-01 ENCOUNTER — Encounter: Payer: Self-pay | Admitting: Obstetrics and Gynecology

## 2015-05-01 MED ORDER — BISACODYL 10 MG RE SUPP: 10.0000 mg | Freq: Every day | RECTAL | Status: DC | PRN

## 2015-05-01 MED ORDER — DOCUSATE SODIUM 100 MG PO CAPS
100.0000 mg | ORAL_CAPSULE | Freq: Every day | ORAL | Status: DC
  Administered 2015-05-01 – 2015-05-02 (×2): 100 mg via ORAL
  Filled 2015-05-01 (×2): qty 1

## 2015-05-01 NOTE — Progress Notes (Signed)
Subjective:   Feeling well. Denies lightheadedness when OOB. Ambulating. Voiding without difficulty.   Objective:  Blood pressure 116/64, pulse 71, temperature 98.6 F (37 C), temperature source Oral, resp. rate 18, height  (1.6 m), weight 235 lb (106.595 kg), SpO2 99 %, unknown if currently breastfeeding.  General: NAD, appears comfortable Pulmonary: no increased work of breathing, Lungs CTA Heart: RRR with Grade III/VI systolic murmur all areas Abdomen: non-distended, non-tender, BS present Incision: DSG C+D+I Lochia: appropriate Extremities:  no erythema, no tenderness  Results for orders placed or performed during the hospital encounter of 04/28/15 (from the past 72 hour(s))  CBC     Status: Abnormal   Collection Time: 04/29/15  1:36 AM  Result Value Ref Range   WBC 11.6 (H) 3.6 - 11.0 K/uL   RBC 4.31 3.80 - 5.20 MIL/uL   Hemoglobin 12.0 12.0 - 16.0 g/dL   HCT 16.1 09.6 - 04.5 %   MCV 83.9 80.0 - 100.0 fL   MCH 27.9 26.0 - 34.0 pg   MCHC 33.2 32.0 - 36.0 g/dL   RDW 40.9 81.1 - 91.4 %   Platelets 270 150 - 440 K/uL  RPR     Status: None   Collection Time: 04/29/15  1:36 AM  Result Value Ref Range   RPR Ser Ql Non Reactive Non Reactive    Comment: (NOTE) Performed At: [facility] [address] [place], [place] [id number] Mila Homer MD [id number]   ABO/Rh     Status: None   Collection Time: 04/29/15  1:36 AM  Result Value Ref Range   ABO/RH(D) A NEG   Type and screen     Status: None (Preliminary result)   Collection Time: 04/29/15  1:37 AM  Result Value Ref Range   ABO/RH(D) A NEG    Antibody Screen NEG    Sample Expiration 05/02/2015    Unit Number [id number]    Blood Component Type RED CELLS,LR    Unit division 00    Status of Unit ISSUED,FINAL    Transfusion Status OK TO TRANSFUSE    Crossmatch Result Compatible    Unit Number [id number]    Blood Component Type RED CELLS,LR    Unit division 00    Status of Unit ALLOCATED     Transfusion Status OK TO TRANSFUSE    Crossmatch Result Compatible   Prepare RBC     Status: None   Collection Time: 04/29/15  1:37 AM  Result Value Ref Range   Order Confirmation ORDER PROCESSED BY BLOOD BANK   CBC     Status: Abnormal   Collection Time: 04/29/15 11:33 PM  Result Value Ref Range   WBC 18.3 (H) 3.6 - 11.0 K/uL   RBC 3.62 (L) 3.80 - 5.20 MIL/uL   Hemoglobin 10.2 (L) 12.0 - 16.0 g/dL   HCT 25.9 (L) 56.3 - 87.5 %   MCV 85.0 80.0 - 100.0 fL   MCH 28.1 26.0 - 34.0 pg   MCHC 33.0 32.0 - 36.0 g/dL   RDW 64.3 32.9 - 51.8 %   Platelets 229 150 - 440 K/uL  CBC     Status: Abnormal   Collection Time: 04/30/15  5:12 AM  Result Value Ref Range   WBC 16.6 (H) 3.6 - 11.0 K/uL   RBC 3.25 (L) 3.80 - 5.20 MIL/uL   Hemoglobin 9.3 (L) 12.0 - 16.0 g/dL   HCT 84.1 (L) 66.0 - 63.0 %   MCV 84.9 80.0 - 100.0 fL  MCH 28.5 26.0 - 34.0 pg   MCHC 33.5 32.0 - 36.0 g/dL   RDW 16.1 09.6 - 04.5 %   Platelets 217 150 - 440 K/uL   Baby's blood type also A negative  Assessment:   39 y.o. G2P1 postoperativeday # 2-stable   Plan:  1) Acute blood loss anemia - hemodynamically stable and asymptomatic - po ferrous sulfate -discontinue saline lock  2) Rhogam not indicated  3) TDAP UTD   4) Breast and supplementing  5) Disposition-Probable discharge tomorrow Farrel Conners, CNM

## 2015-05-01 NOTE — Progress Notes (Signed)
At 0215 RN in room to wake pt to breastfeed; pt sleepy; RN explained that her baby had a 7.4% weight loss from birth and we need to work on feeding every 2-3 hours and have good feeds; we can also pump after breastfeeds and give the baby pumped colostrum (as a supplement); mom tired and said "can i just pump now and give him what i pump"; "yes"; RN explained that with colostrum when you pump sometimes you don't see any collected that the baby is better at the breast than the pump; we need to supplement with either pumped colostrum (or formula) after breastfeeding; for "this feeding" pt wants to pump, give baby pumped colostrum (ok with using syringe) and then add formula to what she pumps to get a total of [id number]4Marland Kitchen429cNew [place]75m7035(87808.6[facility]7Marland Kitchen178 [place]24m7008.[facility]74m703587008.6[facility]61m703573108.6[facility]8Marland Kitchen228iNew [place]36m70350808.6[facility]1Marland Kitchen76gNew Ca74m7035(34008.6[facility]7Marland Kitchen865mNew Ca67m703593108.6[facility]5Marl62m7035708.6[facility]5Marland Kitchen139tNew Ca49m703560408.6[facility]6Marland Kitchen952nNew [place]16m7035(201)08.6[facility]8Marland Kitchen130aNew Ca45m7035([phone])08.6[facility]2Marland Kitchen54 [place]53m703550408.6[facility]4Marland Kitchen351mNew [place]49m7035808.6[facility]3Marland K43m703(81008.6[facility]7Marland Kit19m703(25208.6[facility] Marlan36m7035(31308.6[facility] 6M49m7035708.6[facility]5Marland 82m703571508.6[facility] [id number][facility]7Marland Kitchen276eNew [place]26m70372408.6[facility]7Marland Kitch2m703553008.6[facility]2Marland Kitchen10tNew Ca5m7035(31208.6[facility]8Marland Kitchen955 [place]46m7035(220)08.6[facility]9Marland Kitchen230pNew Ca69m703597908.6[facility]4Ma50m703540508.6Dr Solomon Carter [facility] Menta6Marland Kitchen354lNe3664m [facility]28 [phone] ManPas[facility]67 LLCDiona([phone] time how "that feeding" will go and pt was ok with that plan

## 2015-05-01 NOTE — Anesthesia Postprocedure Evaluation (Cosign Needed Addendum)
  Anesthesia Post-op Note  Patient: Dawn Terry  Procedure(s) Performed: Procedure(s): CESAREAN SECTION  Anesthesia type:Spinal  Patient location: PACU  Post pain: Pain level controlled  Post assessment: Post-op Vital signs reviewed, Patient's Cardiovascular Status Stable, Respiratory Function Stable, Patent Airway and No signs of Nausea or vomiting  Post vital signs: Reviewed and stable  Last Vitals:  Filed Vitals:   05/01/15 0727  BP: 116/64  Pulse: 71  Temp: 37 C  Resp: 18    Level of consciousness: awake, alert  and patient cooperative  Complications: No apparent anesthesia complications

## 2015-05-02 LAB — TYPE AND SCREEN
ABO/RH(D): A NEG
Antibody Screen: NEGATIVE
Unit division: 0
Unit division: 0

## 2015-05-02 MED ORDER — HYDROCODONE-ACETAMINOPHEN 5-325 MG PO TABS: 1.0000 | ORAL_TABLET | Freq: Four times a day (QID) | ORAL | Status: DC | PRN

## 2015-05-02 MED ORDER — FERROUS SULFATE 325 (65 FE) MG PO TABS: 325.0000 mg | ORAL_TABLET | Freq: Every day | ORAL | Status: DC

## 2015-05-02 MED ORDER — IBUPROFEN 600 MG PO TABS: 600.0000 mg | ORAL_TABLET | Freq: Four times a day (QID) | ORAL | Status: DC | PRN

## 2015-05-02 MED ORDER — DOCUSATE SODIUM 100 MG PO CAPS
100.0000 mg | ORAL_CAPSULE | Freq: Every day | ORAL | Status: DC
Start: 2015-05-02 — End: 2015-07-26

## 2015-05-02 NOTE — Progress Notes (Signed)
Patient discharged home with infant. Vital signs stable, bleeding within normal limits, uterus firm, incision within normal limits. Discharge instructions, prescriptions, and follow up appointment given to and reviewed with patient. Patient verbalized understanding, all questions answered. Escorted in wheelchair by auxiliary.

## 2015-05-02 NOTE — Discharge Instructions (Signed)
° °Cesarean Delivery, Care After °Refer to this sheet in the next few weeks. These instructions provide you with information on caring for yourself after your procedure. Your health care provider may also give you specific instructions. Your treatment has been planned according to current medical practices, but problems sometimes occur. Call your health care provider if you have any problems or questions after you go home. °HOME CARE INSTRUCTIONS  °· If you have an On-Q pump, remove it on the 5th day after your surgery, by removing the dressing/bandage and pulling the pump out. Cover the site where the pump strings came out with a band-aid, as needed. °· Only take over-the-counter or prescription medications as directed by your health care provider. °· Do not drink alcohol, especially if you are breastfeeding or taking medication to relieve pain. °· Do not  smoke tobacco. °· Continue to use good perineal care. Good perineal care includes: °¨ Wiping your perineum from front to back. °¨ Keeping your perineum clean. °· Check your surgical cut (incision) daily for increased redness, drainage, swelling, or separation of skin. °· Shower and clean your incision gently with soap and water every day, by letting warm and soapy water run over the incision, and then pat it dry. If your health care provider says it is okay, leave the incision uncovered. Use a bandage (dressing) if the incision is draining fluid or appears irritated. If the adhesive strips across the incision do not fall off within 7 days, carefully peel them off, after a shower. °· Hug a pillow when coughing or sneezing until your incision is healed. This helps to relieve pain. °· Do not use tampons, douches or have sexual intercourse, until your health care provider says it is okay. °· Wear a well-fitting bra that provides breast support. °· Limit wearing support panties or control-top hose. °· Drink enough fluids to keep your urine clear or pale  yellow. °· Eat high-fiber foods such as whole grain cereals and breads, brown rice, beans, and fresh fruits and vegetables every day. These foods may help prevent or relieve constipation. °· Resume activities such as climbing stairs, driving, lifting, exercising, or traveling as directed by your health care provider. °· Try to have someone help you with your household activities and your newborn for at least a few days after you leave the hospital. °· Rest as much as possible. Try to rest or take a nap when your newborn is sleeping. °· Increase your activities gradually. °· Do not lift more than 15lbs until directed by a provider. °· Keep all of your scheduled postpartum appointments. It is very important to keep your scheduled follow-up appointments. At these appointments, your health care provider will be checking to make sure that you are healing physically and emotionally. °SEEK MEDICAL CARE IF:  °· You are passing large clots from your vagina. Save any clots to show your health care provider. °· You have a foul smelling discharge from your vagina. °· You have trouble urinating. °· You are urinating frequently. °· You have pain when you urinate. °· You have a change in your bowel movements. °· You have increasing redness, pain, or swelling near your incision. °· You have pus draining from your incision. °· Your incision is separating. °· You have painful, hard, or reddened breasts. °· You have a severe headache. °· You have blurred vision or see spots. °· You feel sad or depressed. °· You have thoughts of hurting yourself or your newborn. °· You have questions about your   care, the care of your newborn, or medications. °· You are dizzy or light-headed. °· You have a rash. °· You have pain, redness, or swelling at the site of the removed intravenous access (IV) tube. °· You have nausea or vomiting. °· You stopped breastfeeding and have not had a menstrual period within 12 weeks of stopping. °· You are not  breastfeeding and have not had a menstrual period within 12 weeks of delivery. °· You have a fever. °SEEK IMMEDIATE MEDICAL CARE IF: °· You have persistent pain. °· You have chest pain. °· You have shortness of breath. °· You faint. °· You have leg pain. °· You have stomach pain. °· Your vaginal bleeding saturates 2 or more sanitary pads in 1 hour. °MAKE SURE YOU:  °· Understand these instructions. °· Will watch your condition. °· Will get help right away if you are not doing well or get worse. °Document Released: 04/13/2002 Document Revised: 12/06/2013 Document Reviewed: 03/18/2012 °ExitCare® Patient Information ©2015 [employer]. This information is not intended to replace advice given to you by your health care provider. Make sure you discuss any questions you have with your health care provider. ° ° °

## 2015-05-02 NOTE — Progress Notes (Signed)
Daily Post Partum Note  Dawn Terry is a 39 y.o. [id number]  POD#3 s/p pLTCS for fetal intolerance of labor remote @ [redacted]w[redacted]d.  Pregnancy c/b BMI 43, Rh negative, AMA, ?fetal pyelectasis. Surgery c/b PPH due to delivery issues (2L and s/p 1U PRBCs) 24hr/overnight events:  none  Subjective:  Meeting all PO goals. No s/s of anemia. +flatus and no BMs  Objective:    Current Vital Signs 24h Vital Sign Ranges  T 97.9 F (36.6 C) Temp  Avg: 98.3 F (36.8 C)  Min: 97.2 F (36.2 C)  Max: 99.1 F (37.3 C)  BP 110/60 mmHg BP  Min: 109/48  Max: 125/49  HR 81 Pulse  Avg: 86.5  Min: 81  Max: 90  RR 20 Resp  Avg: 19  Min: 18  Max: 20  SaO2 98 % Not Delivered SpO2  Avg: 97.7 %  Min: 97 %  Max: 98 %       24 Hour I/O Current Shift I/O  Time Ins Outs        General: NAD Abdomen: c/d/i incision with steri strips in place and on q in place. Obese, +BS Perineum: deferred Skin:  Warm and dry.  Cardiovascular:Regular rate and rhythm. Respiratory:  Clear to auscultation bilateral. Normal respiratory effort Extremities: no c/c/e  Medications Current Facility-Administered Medications  Medication Dose Route Frequency Provider Last Rate Last Dose  . bisacodyl (DULCOLAX) suppository 10 mg  10 mg Rectal Daily PRN Farrel Conners, CNM      . bupivacaine 0.25 % ON-Q pump DUAL CATH 400 mL  400 mL Other Continuous Conard Novak, MD   400 mL at 04/29/15 1723  . bupivacaine ON-Q pain pump   Other Continuous Conard Novak, MD      . witch hazel-glycerin (TUCKS) pad 1 application  1 application Topical PRN Conard Novak, MD       And  . dibucaine (NUPERCAINAL) 1 % rectal ointment 1 application  1 application Rectal PRN Conard Novak, MD      . diphenhydrAMINE (BENADRYL) capsule 25 mg  25 mg Oral Q6H PRN Conard Novak, MD      . docusate sodium (COLACE) capsule 100 mg  100 mg Oral Daily Farrel Conners, CNM   100 mg at 05/01/15 1212  . ferrous sulfate tablet 325 mg  325 mg Oral  BID WC Conard Novak, MD   325 mg at 05/01/15 1810  . HYDROcodone-acetaminophen (NORCO/VICODIN) 5-325 MG per tablet 1-2 tablet  1-2 tablet Oral Q4H PRN Naomie Dean, MD   1 tablet at 05/02/15 0002  . lanolin ointment 1 application  1 application Topical PRN Conard Novak, MD      . menthol-cetylpyridinium (CEPACOL) lozenge 3 mg  1 lozenge Oral Q2H PRN Conard Novak, MD      . meperidine (DEMEROL) injection 6.25 mg  6.25 mg Intravenous Q5 min PRN Naomie Dean, MD      . ondansetron [facility]) injection 4 mg  4 mg Intravenous Q6H PRN Conard Novak, MD   4 mg at 04/30/15 0051  . prenatal multivitamin tablet 1 tablet  1 tablet Oral Q1200 Conard Novak, MD   1 tablet at 05/01/15 1212  . senna-docusate (Senokot-S) tablet 2 tablet  2 tablet Oral Q24H Conard Novak, MD   2 tablet at 05/01/15 2359  . simethicone (MYLICON) chewable tablet 80 mg  80 mg Oral TID PC Conard Novak, MD   80 mg  at 05/01/15 1810     Recent Labs Lab 04/29/15 0136 04/29/15 2333 04/30/15 0512  WBC 11.6* 18.3* 16.6*  HGB 12.0 10.2* 9.3*  HCT 36.1 30.7* 27.6*  PLT 270 229 217    Assessment & Plan:  Pt doing well *Postpartum/postop: routine care *Rh negative: infant is Rh negative too *Dispo: later today  A NEG / Rubella Immune / Varicella Immune/  RPR negative / HIV negative / HepBsAg negative / Tdap UTD: Yes/pap 2014 neg / Breast  / Contraception: not d/w pt / Follow up: [facility], 1wk  Charlie Pickens, Jr. MD [facility] Pager [phone]

## 2015-05-04 MED ORDER — MORPHINE SULFATE (PF) 0.5 MG/ML IJ SOLN
INTRAMUSCULAR | Status: DC | PRN
  Administered 2015-04-29: 200 ug via EPIDURAL

## 2015-05-04 NOTE — Anesthesia Post-op Follow-up Note (Cosign Needed)
  Anesthesia Pain Follow-up Note  Patient: Dawn Terry  Day #: 1  Date of Follow-up: 05/04/2015 Time: 11:16 AM  Last Vitals:  Filed Vitals:   05/02/15 1151  BP:   Pulse:   Temp: 36.9 C  Resp:     Level of Consciousness: alert  Pain: mild   Side Effects:None  Catheter Site Exam:clean, dry, no drainage  Plan: Continue current therapy  Noles,  Sheran Fava

## 2015-07-05 ENCOUNTER — Other Ambulatory Visit: Payer: 59

## 2015-07-25 ENCOUNTER — Other Ambulatory Visit: Payer: 59

## 2015-07-25 NOTE — Pre-Procedure Instructions (Signed)
CALLED FOR ORDERS

## 2015-07-26 ENCOUNTER — Encounter: Payer: Self-pay | Admitting: *Deleted

## 2015-07-26 NOTE — Patient Instructions (Signed)
  Your procedure is scheduled on: 08-04-15 (FRIDAY) Report to [facility] 2ND FLOOR To find out your arrival time please call [phone] between 1PM - 3PM on 08-03-15 (THURSDAY)  Remember: Instructions that are not followed completely may result in serious medical risk, up to and including death, or upon the discretion of your surgeon and anesthesiologist your surgery may need to be rescheduled.    _X___ 1. Do not eat food or drink liquids after midnight. No gum chewing or hard candies.     _X___ 2. No Alcohol for 24 hours before or after surgery.   ____ 3. Bring all medications with you on the day of surgery if instructed.    _X___ 4. Notify your doctor if there is any change in your medical condition     (cold, fever, infections).     Do not wear jewelry, make-up, hairpins, clips or nail polish.  Do not wear lotions, powders, or perfumes. You may wear deodorant.  Do not shave 48 hours prior to surgery. Men may shave face and neck.  Do not bring valuables to the hospital.    [facility] or [facility]uables.               Contacts, dentures or bridgework may not be worn into surgery.  Leave your suitcase in the car. After surgery it may be brought to your room.  For patients admitted to the hospital, discharge time is determined by your  treatment team.   Patients discharged the day of surgery will not be allowed to drive home.   Please read over the following fact sheets that you were given:      ____ Take these medicines the morning of surgery with A SIP OF WATER:    1. NONE  2.   3.   4.  5.  6.  ____ Fleet Enema (as directed)   ____ Use CHG Soap as directed  ____ Use inhalers on the day of surgery  ____ Stop metformin 2 days prior to surgery    ____ Take 1/2 of usual insulin dose the night before surgery and none on the morning of surgery.   ____ Stop Coumadin/Plavix/aspirin-N/A  ____ Stop  Anti-inflammatories-NO NSAIDS OR ASA PRODUCTS-TYLENOL OK TO CONTINUE   ____ Stop supplements until after surgery.    ____ Bring C-Pap to the hospital.

## 2015-08-01 ENCOUNTER — Encounter
Admission: RE | Admit: 2015-08-01 | Discharge: 2015-08-01 | Disposition: A | Discharge status: Home / Self Care | Payer: 59 | Source: Ambulatory Visit | Attending: Obstetrics and Gynecology | Admitting: Obstetrics and Gynecology

## 2015-08-01 DIAGNOSIS — E7800 Pure hypercholesterolemia, unspecified: Secondary | ICD-10-CM | POA: Diagnosis not present

## 2015-08-01 DIAGNOSIS — Z6841 Body Mass Index (BMI) 40.0 and over, adult: Secondary | ICD-10-CM | POA: Diagnosis not present

## 2015-08-01 DIAGNOSIS — Z302 Encounter for sterilization: Secondary | ICD-10-CM | POA: Diagnosis present

## 2015-08-01 DIAGNOSIS — Z79899 Other long term (current) drug therapy: Secondary | ICD-10-CM | POA: Diagnosis not present

## 2015-08-01 LAB — TYPE AND SCREEN
ABO/RH(D): A NEG
ANTIBODY SCREEN: NEGATIVE

## 2015-08-01 LAB — HEMOGLOBIN: HEMOGLOBIN: 14 g/dL (ref 12.0–16.0)

## 2015-08-04 ENCOUNTER — Ambulatory Visit
Admission: RE | Admit: 2015-08-04 | Discharge: 2015-08-04 | Disposition: A | Discharge status: Home / Self Care | Payer: 59 | Source: Ambulatory Visit | Attending: Obstetrics and Gynecology | Admitting: Obstetrics and Gynecology

## 2015-08-04 ENCOUNTER — Ambulatory Visit: Payer: 59 | Admitting: Anesthesiology

## 2015-08-04 ENCOUNTER — Encounter
Admission: RE | Disposition: A | Discharge status: Home / Self Care | Payer: Self-pay | Source: Ambulatory Visit | Attending: Obstetrics and Gynecology

## 2015-08-04 ENCOUNTER — Encounter: Payer: Self-pay | Admitting: *Deleted

## 2015-08-04 DIAGNOSIS — Z6841 Body Mass Index (BMI) 40.0 and over, adult: Secondary | ICD-10-CM | POA: Insufficient documentation

## 2015-08-04 DIAGNOSIS — E7800 Pure hypercholesterolemia, unspecified: Secondary | ICD-10-CM | POA: Insufficient documentation

## 2015-08-04 DIAGNOSIS — Z9851 Tubal ligation status: Secondary | ICD-10-CM

## 2015-08-04 DIAGNOSIS — Z302 Encounter for sterilization: Secondary | ICD-10-CM | POA: Insufficient documentation

## 2015-08-04 DIAGNOSIS — Z79899 Other long term (current) drug therapy: Secondary | ICD-10-CM | POA: Insufficient documentation

## 2015-08-04 HISTORY — DX: Headache: R51

## 2015-08-04 HISTORY — DX: Cardiac arrhythmia, unspecified: I49.9

## 2015-08-04 HISTORY — DX: Headache, unspecified: R51.9

## 2015-08-04 HISTORY — DX: Anemia, unspecified: D64.9

## 2015-08-04 HISTORY — PX: LAPAROSCOPIC TUBAL LIGATION: SHX1937

## 2015-08-04 LAB — POCT PREGNANCY, URINE: Preg Test, Ur: NEGATIVE

## 2015-08-04 SURGERY — LIGATION, FALLOPIAN TUBE, LAPAROSCOPIC
Anesthesia: General | Site: Abdomen | Laterality: Bilateral | Wound class: Clean Contaminated

## 2015-08-04 MED ORDER — NEOSTIGMINE METHYLSULFATE 10 MG/10ML IV SOLN
INTRAVENOUS | Status: DC | PRN
  Administered 2015-08-04: 2 mg via INTRAVENOUS

## 2015-08-04 MED ORDER — OXYCODONE HCL 5 MG PO TABS: 5.0000 mg | ORAL_TABLET | Freq: Once | ORAL | Status: DC | PRN

## 2015-08-04 MED ORDER — LACTATED RINGERS IV SOLN: INTRAVENOUS | Status: DC

## 2015-08-04 MED ORDER — BUPIVACAINE-EPINEPHRINE (PF) 0.25% -1:200000 IJ SOLN
INTRAMUSCULAR | Status: AC
  Filled 2015-08-04: qty 30

## 2015-08-04 MED ORDER — BUPIVACAINE-EPINEPHRINE (PF) 0.25% -1:200000 IJ SOLN
INTRAMUSCULAR | Status: DC | PRN
  Administered 2015-08-04: 5 mL via PERINEURAL

## 2015-08-04 MED ORDER — GLYCOPYRROLATE 0.2 MG/ML IJ SOLN
INTRAMUSCULAR | Status: DC | PRN
  Administered 2015-08-04: 0.2 mg via INTRAVENOUS

## 2015-08-04 MED ORDER — LACTATED RINGERS IV SOLN
INTRAVENOUS | Status: DC
  Administered 2015-08-04 (×3): via INTRAVENOUS

## 2015-08-04 MED ORDER — IBUPROFEN 600 MG PO TABS: 600.0000 mg | ORAL_TABLET | Freq: Four times a day (QID) | ORAL | Status: DC | PRN

## 2015-08-04 MED ORDER — LIDOCAINE HCL (CARDIAC) 20 MG/ML IV SOLN
INTRAVENOUS | Status: DC | PRN
  Administered 2015-08-04: 40 mg via INTRAVENOUS

## 2015-08-04 MED ORDER — SILVER NITRATE-POT NITRATE 75-25 % EX MISC
CUTANEOUS | Status: AC
  Filled 2015-08-04: qty 1

## 2015-08-04 MED ORDER — SILVER NITRATE-POT NITRATE 75-25 % EX MISC
CUTANEOUS | Status: DC | PRN
  Administered 2015-08-04: 2

## 2015-08-04 MED ORDER — MIDAZOLAM HCL 2 MG/2ML IJ SOLN
INTRAMUSCULAR | Status: DC | PRN
  Administered 2015-08-04: 2 mg via INTRAVENOUS

## 2015-08-04 MED ORDER — ONDANSETRON HCL 4 MG/2ML IJ SOLN
INTRAMUSCULAR | Status: DC | PRN
  Administered 2015-08-04: 4 mg via INTRAVENOUS

## 2015-08-04 MED ORDER — DEXAMETHASONE SODIUM PHOSPHATE 10 MG/ML IJ SOLN
INTRAMUSCULAR | Status: DC | PRN
  Administered 2015-08-04: 8 mg via INTRAVENOUS

## 2015-08-04 MED ORDER — FAMOTIDINE 20 MG PO TABS
ORAL_TABLET | ORAL | Status: AC
  Administered 2015-08-04: 20 mg via ORAL
  Filled 2015-08-04: qty 1

## 2015-08-04 MED ORDER — FENTANYL CITRATE (PF) 100 MCG/2ML IJ SOLN
INTRAMUSCULAR | Status: AC
  Administered 2015-08-04: 50 ug via INTRAVENOUS
  Filled 2015-08-04: qty 2

## 2015-08-04 MED ORDER — FAMOTIDINE 20 MG PO TABS
20.0000 mg | ORAL_TABLET | Freq: Once | ORAL | Status: AC
  Administered 2015-08-04: 20 mg via ORAL

## 2015-08-04 MED ORDER — IPRATROPIUM-ALBUTEROL 0.5-2.5 (3) MG/3ML IN SOLN
RESPIRATORY_TRACT | Status: AC
  Administered 2015-08-04: 3 mL via RESPIRATORY_TRACT
  Filled 2015-08-04: qty 3

## 2015-08-04 MED ORDER — ROCURONIUM BROMIDE 100 MG/10ML IV SOLN
INTRAVENOUS | Status: DC | PRN
  Administered 2015-08-04: 30 mg via INTRAVENOUS

## 2015-08-04 MED ORDER — IPRATROPIUM-ALBUTEROL 0.5-2.5 (3) MG/3ML IN SOLN
3.0000 mL | Freq: Once | RESPIRATORY_TRACT | Status: AC
  Administered 2015-08-04: 3 mL via RESPIRATORY_TRACT

## 2015-08-04 MED ORDER — ONDANSETRON HCL 4 MG/2ML IJ SOLN
INTRAMUSCULAR | Status: AC
  Filled 2015-08-04: qty 2

## 2015-08-04 MED ORDER — FENTANYL CITRATE (PF) 100 MCG/2ML IJ SOLN
INTRAMUSCULAR | Status: DC | PRN
  Administered 2015-08-04: 100 ug via INTRAVENOUS

## 2015-08-04 MED ORDER — FENTANYL CITRATE (PF) 100 MCG/2ML IJ SOLN
25.0000 ug | INTRAMUSCULAR | Status: DC | PRN
  Administered 2015-08-04 (×2): 50 ug via INTRAVENOUS

## 2015-08-04 MED ORDER — OXYCODONE HCL 5 MG/5ML PO SOLN: 5.0000 mg | Freq: Once | ORAL | Status: DC | PRN

## 2015-08-04 MED ORDER — PROPOFOL 10 MG/ML IV BOLUS
INTRAVENOUS | Status: DC | PRN
  Administered 2015-08-04: 160 mg via INTRAVENOUS

## 2015-08-04 MED ORDER — HYDROCODONE-ACETAMINOPHEN 5-325 MG PO TABS: 1.0000 | ORAL_TABLET | ORAL | Status: DC | PRN

## 2015-08-04 SURGICAL SUPPLY — 28 items
BLADE SURG SZ11 CARB STEEL (BLADE) ×3 IMPLANT
CATH FOLEY 2WAY  5CC 16FR (CATHETERS) ×2
CATH ROBINSON RED A/P 16FR (CATHETERS) IMPLANT
CATH URTH 16FR FL 2W BLN LF (CATHETERS) ×1 IMPLANT
CHLORAPREP W/TINT 26ML (MISCELLANEOUS) ×3 IMPLANT
CLIP FILSHIE TUBAL LIGA STRL (Clip) ×6 IMPLANT
DRAPE LAPAROTOMY 100X77 ABD (DRAPES) ×3 IMPLANT
DRAPE LEGGINS SURG 28X43 STRL (DRAPES) ×3 IMPLANT
GLOVE BIO SURGEON STRL SZ7 (GLOVE) ×6 IMPLANT
GLOVE BIOGEL PI IND STRL 7.5 (GLOVE) ×1 IMPLANT
GLOVE BIOGEL PI INDICATOR 7.5 (GLOVE) ×2
GOWN STRL REUS W/ TWL LRG LVL3 (GOWN DISPOSABLE) ×2 IMPLANT
GOWN STRL REUS W/TWL LRG LVL3 (GOWN DISPOSABLE) ×4
KIT RM TURNOVER CYSTO AR (KITS) ×3 IMPLANT
LABEL OR SOLS (LABEL) ×3 IMPLANT
LIQUID BAND (GAUZE/BANDAGES/DRESSINGS) ×3 IMPLANT
NEEDLE HYPO 22GX1.5 SAFETY (NEEDLE) ×3 IMPLANT
NS IRRIG 500ML POUR BTL (IV SOLUTION) ×3 IMPLANT
PACK GYN LAPAROSCOPIC (MISCELLANEOUS) ×3 IMPLANT
PAD OB MATERNITY 4.3X12.25 (PERSONAL CARE ITEMS) ×3 IMPLANT
PAD PREP 24X41 OB/GYN DISP (PERSONAL CARE ITEMS) ×3 IMPLANT
SLEEVE ENDOPATH XCEL 5M (ENDOMECHANICALS) ×6 IMPLANT
SUT MNCRL 3-0 UNDYED SH (SUTURE) ×1 IMPLANT
SUT MONOCRYL 3-0 UNDYED (SUTURE) ×2
SYRINGE 10CC LL (SYRINGE) ×3 IMPLANT
TROCAR ENDO BLADELESS 11MM (ENDOMECHANICALS) ×3 IMPLANT
TROCAR XCEL NON-BLD 5MMX100MML (ENDOMECHANICALS) ×3 IMPLANT
TUBING INSUFFLATOR HI FLOW (MISCELLANEOUS) ×3 IMPLANT

## 2015-08-04 NOTE — Anesthesia Procedure Notes (Signed)
Procedure Name: Intubation Date/Time: 08/04/2015 12:30 PM Performed by: Henrietta HooverPOPE, Karma Hiney Pre-anesthesia Checklist: Patient identified, Emergency Drugs available, Suction available, Patient being monitored and Timeout performed Patient Re-evaluated:Patient Re-evaluated prior to inductionOxygen Delivery Method: Circle system utilized Preoxygenation: Pre-oxygenation with 100% oxygen Intubation Type: IV induction Ventilation: Mask ventilation without difficulty Laryngoscope Size: Mac and 3 Grade View: Grade I Tube type: Oral Tube size: 7.0 mm Number of attempts: 1 Placement Confirmation: ETT inserted through vocal cords under direct vision,  positive ETCO2 and breath sounds checked- equal and bilateral Secured at: 22 cm Tube secured with: Tape Dental Injury: Teeth and Oropharynx as per pre-operative assessment

## 2015-08-04 NOTE — H&P (Signed)
History and Physical Interval Note:  Dawn Terry  has presented today for surgery, with the diagnosis of DESIRE FOR PERMANENT STERILITY  The various methods of treatment have been discussed with the patient and family. After consideration of risks, benefits and other options for treatment, the patient has consented to  Procedure(s): LAPAROSCOPIC TUBAL LIGATION (Bilateral) as a surgical intervention .  The patient's history has been reviewed, patient examined, no change in status, stable for surgery.  I have reviewed the patient's chart and labs.  Questions were answered to the patient's satisfaction.    Conard NovakJackson, Traniya Prichett D, MD 08/04/2015 12:18 PM

## 2015-08-04 NOTE — Discharge Instructions (Signed)

## 2015-08-04 NOTE — Transfer of Care (Signed)
Immediate Anesthesia Transfer of Care Note  Patient: Dawn Terry  Procedure(s) Performed: Procedure(s): LAPAROSCOPIC TUBAL LIGATION (Bilateral)  Patient Location: PACU  Anesthesia Type:General  Level of Consciousness: awake  Airway & Oxygen Therapy: Patient Spontanous Breathing  Post-op Assessment: Report given to RN  Post vital signs: stable  Last Vitals:  Filed Vitals:   08/04/15 0918 08/04/15 1334  BP: 114/62 122/65  Pulse: 59 60  Temp: 36.7 C 37.8 C  Resp: 18     Complications: No apparent anesthesia complications

## 2015-08-04 NOTE — Anesthesia Preprocedure Evaluation (Addendum)
Anesthesia Evaluation  Patient identified by MRN, date of birth, ID band Patient awake    Reviewed: Allergy & Precautions, H&P , NPO status , Patient's Chart, lab work & pertinent test results  History of Anesthesia Complications Negative for: history of anesthetic complications  Airway Mallampati: III  TM Distance: <3 FB Neck ROM: full    Dental no notable dental hx. (+) Teeth Intact   Pulmonary neg shortness of breath, Current Smoker,    Pulmonary exam normal breath sounds clear to auscultation       Cardiovascular Exercise Tolerance: Good (-) angina(-) Past MI and (-) DOE Normal cardiovascular exam+ dysrhythmias  Rhythm:regular Rate:Normal     Neuro/Psych  Headaches, negative psych ROS   GI/Hepatic negative GI ROS, Neg liver ROS, neg GERD  ,  Endo/Other  Morbid obesity  Renal/GU negative Renal ROS  negative genitourinary   Musculoskeletal   Abdominal   Peds  Hematology negative hematology ROS (+)   Anesthesia Other Findings Past Medical History:   Headache                                                       Comment:MIGRAINES   Anemia                                                         Comment:BLOOD TRANSFUSION AFTER C/S   Dysrhythmia                                                    Comment:H/O YEARS AGO HEART SKIPS A BEAT-PT               ASYMPTOMATIC   Past Surgical History:   CESAREAN SECTION                                 04/29/2015      Comment:Procedure: CESAREAN SECTION;  Surgeon: Conard NovakStephen               D Jackson, MD;  Location: [facility] ORS;  Service:               Obstetrics;;  BMI    Body Mass Index   41.55 kg/m 2      Reproductive/Obstetrics negative OB ROS                            Anesthesia Physical Anesthesia Plan  ASA: III  Anesthesia Plan: General ETT   Post-op Pain Management:    Induction:   Airway Management Planned:   Additional Equipment:    Intra-op Plan:   Post-operative Plan:   Informed Consent: I have reviewed the patients History and Physical, chart, labs and discussed the procedure including the risks, benefits and alternatives for the proposed anesthesia with the patient or authorized representative who has indicated his/her understanding and acceptance.   Dental Advisory Given  Plan Discussed with: Anesthesiologist, CRNA and  Surgeon  Anesthesia Plan Comments:         Anesthesia Quick Evaluation

## 2015-08-04 NOTE — Op Note (Signed)
Op Note Laparoscopic Bilateral Tubal Ligation  Pre-Op Diagnosis: multiparity, desires permanent sterilization  Post-Op Diagnosis: multiparity, desires permanent sterilization  Procedures: Laparoscopic bilateral tubal ligation using Filshie Clips  Primary Surgeon: Dawn MohairStephen Kerrington Greenhalgh, MD  EBL: 5 ml   IVF: 750 mL   Urine output: 100 mL  Specimens: None  Drains: None  Complications: None   Disposition: PACU   Condition: Stable   Findings:  1) adhesions of omentum to anterior abdominal wall 2) normal appearing uterus, fallopian tubes,and ovaries  Indication: The patient is a 39 y.o. female who desires permanent sterilization.  The various methods of contraception, both permanent and nonpermanent, have been explained to the patient. She strongly desires a permanent form of sterilization. She is aware that there is a risk of failure of the procedure of 3-5 out of every 1000 procedures. She also understands that should she become pregnant, she is at higher risk of an ectopic pregnancy.  Procedure Summary:  The patient was taken to the operating room where general anesthesia was administered and found to be adequate. She was placed in the dorsal supine lithotomy position in [place] stirrups and prepped and draped in usual sterile fashion. After a timeout was called an indwelling catheter was placed in her bladder. A sterile speculum was placed in the vagina and a single-tooth tenaculum was used to grasp the anterior lip of the cervix. An acorn uterine manipulator was affixed to the tenaculum. The speculum was removed from the vagina.  Attention was turned to the abdomen where after injection of local anesthetic, a 5 mm infraumbilical incision was made with the scalpel. Entry into the abdomen was obtained via Optiview trocar technique (a blunt entry technique with camera visualization through the obturator upon entry). Verification of entry into the abdomen was obtained using opening  pressures. The abdomen was insufflated with CO2. The camera was introduced through the trocar with verification of atraumatic entry.  An 11mm suprapubic trocar was placed under direct intra-abdominal camera visualization without incident.    Filshie applicator was advanced through the port and right fallopian tube elevated.  Filshie clip applied at the proximal tube with care to occlude the entire diameter of the tube.  Filshie clip applied without incident.  In a similar fashion, the contralateral tube was identified, grasped with Filshie applicator and clip was applied to the proximal tube without incident.    Bilateral sites were visualized and complete occlusion confirmed.  All instruments were removed, abdomen deflated, and skin closed with skin glue.  Prior to placement of skin glue, the 11mm port site was closed using 4-0 monocryl in a subcuticular fashion.  Sponge, lap, and instrument counts were correct x 2.  VTE prophylaxis: SCDs. Antibiotic prophylaxis: none indicated. The patient tolerated the procedure well and was taken to the PACU in stable condition.   Dawn MohairStephen Emmarie Sannes, MD 08/04/2015 1:29 PM

## 2015-08-06 NOTE — Anesthesia Postprocedure Evaluation (Signed)
Anesthesia Post Note  Patient: Dawn Terry  Procedure(s) Performed: Procedure(s) (LRB): LAPAROSCOPIC TUBAL LIGATION (Bilateral)  Patient location during evaluation: PACU Anesthesia Type: General Level of consciousness: awake and alert Pain management: pain level controlled Vital Signs Assessment: post-procedure vital signs reviewed and stable Respiratory status: spontaneous breathing, nonlabored ventilation, respiratory function stable and patient connected to nasal cannula oxygen Cardiovascular status: blood pressure returned to baseline and stable Postop Assessment: no signs of nausea or vomiting Anesthetic complications: no    Last Vitals:  Filed Vitals:   08/04/15 1431 08/04/15 1437  BP: 128/80 126/75  Pulse: 55 56  Temp:    Resp: 16 16    Last Pain:  Filed Vitals:   08/04/15 1445  PainSc: 2                  Cleda MccreedyJoseph K Piscitello

## 2018-06-15 DIAGNOSIS — L03115 Cellulitis of right lower limb: Secondary | ICD-10-CM | POA: Diagnosis not present

## 2018-06-15 DIAGNOSIS — R509 Fever, unspecified: Secondary | ICD-10-CM | POA: Diagnosis not present

## 2018-06-15 DIAGNOSIS — R6889 Other general symptoms and signs: Secondary | ICD-10-CM | POA: Diagnosis not present

## 2019-03-31 ENCOUNTER — Ambulatory Visit: Payer: 59 | Admitting: Urology

## 2019-03-31 ENCOUNTER — Encounter: Payer: Self-pay | Admitting: Urology

## 2019-03-31 ENCOUNTER — Other Ambulatory Visit: Payer: Self-pay

## 2019-03-31 ENCOUNTER — Other Ambulatory Visit: Payer: Self-pay | Admitting: Urology

## 2019-03-31 ENCOUNTER — Ambulatory Visit
Admission: RE | Admit: 2019-03-31 | Discharge: 2019-03-31 | Disposition: A | Discharge status: Home / Self Care | Payer: 59 | Source: Ambulatory Visit | Attending: Urology | Admitting: Urology

## 2019-03-31 VITALS — BP 136/83 | HR 118 | Ht 62.5 in | Wt 245.0 lb

## 2019-03-31 DIAGNOSIS — R1032 Left lower quadrant pain: Secondary | ICD-10-CM

## 2019-03-31 DIAGNOSIS — N200 Calculus of kidney: Secondary | ICD-10-CM

## 2019-03-31 NOTE — Progress Notes (Signed)
03/31/19 4:29 PM   Dawn Terry 1975/10/02 [id number]  Referring provider: Frederica Kuster, PA-C [place],  [place] [address]  CC: Left flank pain  HPI: I saw Ms. Blankenbaker in urology clinic in consultation for left-sided flank pain with possible nephrolithiasis from Dawn Terry, [place].  She is a 43 year old healthy female who presents with 36 hours of intermittent left-sided flank and groin pain as well as nausea and one episode of vomiting.  She denies any fevers, chills, dysuria, or gross hematuria.  KUB was performed at her PCP that suggested a 7 mm left proximal ureteral stone, follow-up CT today confirms a 7 mm left proximal ureteral stone, 750HU, 13.5 cm skin to stone distance.  Urinalysis was benign with greater than 50 RBCs, 0 WBCs, rare bacteria, rare squamous epithelial cells, nitrite negative.  She was started on Cipro prophylactically, anti-inflammatories, and Phenergan as needed.  Her pain is currently well controlled on naproxen, and her last dose of naproxen was around lunchtime today.  There are no aggravating factors.  Severity is moderate.  She denies any prior episodes of nephrolithiasis.  Her mom has an extensive history of stones.   PMH: Past Medical History:  Diagnosis Date  . Anemia    BLOOD TRANSFUSION AFTER C/S  . Dysrhythmia    H/O YEARS AGO HEART SKIPS A BEAT-PT ASYMPTOMATIC   . Headache    MIGRAINES    Surgical History: Past Surgical History:  Procedure Laterality Date  . CESAREAN SECTION  04/29/2015   Procedure: CESAREAN SECTION;  Surgeon: Will Bonnet, MD;  Location: [facility];  Service: Obstetrics;;  . LAPAROSCOPIC TUBAL LIGATION Bilateral 08/04/2015   Procedure: LAPAROSCOPIC TUBAL LIGATION;  Surgeon: Will Bonnet, MD;  Location: [facility];  Service: Gynecology;  Laterality: Bilateral;    Allergies: No Known Allergies  Family History: Family history of nephrolithiasis in her mother  Social History:  reports that she  has been smoking cigarettes. She has a 0.50 pack-year smoking history. She has never used smokeless tobacco. She reports that she does not drink alcohol or use drugs.  ROS: Please see flowsheet from today's date for complete review of systems.  Physical Exam: BP 136/83   Pulse (!) 118   Ht 5' 2.5" (1.588 m)   Wt 245 lb (111.1 kg)   LMP 03/16/2019   BMI 44.10 kg/m    Constitutional:  Alert and oriented, No acute distress. Cardiovascular: No clubbing, cyanosis, or edema. Respiratory: Normal respiratory effort, no increased work of breathing. GI: Abdomen is soft, nontender, nondistended, no abdominal masses GU: Left CVA tenderness Lymph: No cervical or inguinal lymphadenopathy. Skin: No rashes, bruises or suspicious lesions. Neurologic: Grossly intact, no focal deficits, moving all 4 extremities. Psychiatric: Normal mood and affect.  Laboratory Data: Reviewed  Pertinent Imaging: I have personally reviewed the CT stone protocol today.  7 mm left proximal ureteral stone, 750HU, 13.5 skin to stone distance.  I am unable to personally review the KUB from her PCP, however the report mentions a proximal 7 mm left ureteral stone.  Assessment & Plan:   In summary, the patient is a 43 year old female with her first ever stone episode and a 7 mm left proximal ureteral stone with currently well controlled pain.  She has no laboratory or clinical signs of infection.  We discussed various treatment options for urolithiasis including observation with or without medical expulsive therapy, shockwave lithotripsy (SWL), ureteroscopy and laser lithotripsy with stent placement, and percutaneous nephrolithotomy.  We  discussed that management is based on stone size, location, density, patient co-morbidities, and patient preference.   Stones <1025mm in size have a >80% spontaneous passage rate. Data surrounding the use of tamsulosin for medical expulsive therapy is controversial, but meta analyses suggests  it is most efficacious for distal stones between 5-7210mm in size. Possible side effects include dizziness/lightheadedness, and retrograde ejaculation.  SWL has a lower stone free rate in a single procedure, but also a lower complication rate compared to ureteroscopy and avoids a stent and associated stent related symptoms. Possible complications include renal hematoma, steinstrasse, and need for additional treatment.  Ureteroscopy with laser lithotripsy and stent placement has a higher stone free rate than SWL in a single procedure, however increased complication rate including possible infection, ureteral injury, bleeding, and stent related morbidity. Common stent related symptoms include dysuria, urgency/frequency, and flank pain.  After an extensive discussion of the risks and benefits of the above treatment options, the patient would like to proceed with shockwave lithotripsy.  Schedule left shockwave lithotripsy Thursday 9/3.  Unable to schedule for tomorrow secondary to NSAID use today and UPJ stone.  Sondra ComeBrian C Texanna Hilburn, MD  [facility] [address], [address] [place], [place] [id number] [phone]

## 2019-03-31 NOTE — Patient Instructions (Signed)
Lithotripsy  Lithotripsy is a treatment that can sometimes help eliminate kidney stones and the pain that they cause. A form of lithotripsy, also known as extracorporeal shock wave lithotripsy, is a nonsurgical procedure that crushes a kidney stone with shock waves. These shock waves pass through your body and focus on the kidney stone. They cause the kidney stone to break up while it is still in the urinary tract. This makes it easier for the smaller pieces of stone to pass in the urine. Tell a health care provider about:  Any allergies you have.  All medicines you are taking, including vitamins, herbs, eye drops, creams, and over-the-counter medicines.  Any blood disorders you have.  Any surgeries you have had.  Any medical conditions you have.  Whether you are pregnant or may be pregnant.  Any problems you or family members have had with anesthetic medicines. What are the risks? Generally, this is a safe procedure. However, problems may occur, including:  Infection.  Bleeding of the kidney.  Bruising of the kidney or skin.  Scarring of the kidney, which can lead to: ? Increased blood pressure. ? Poor kidney function. ? Return (recurrence) of kidney stones.  Damage to other structures or organs, such as the liver, colon, spleen, or pancreas.  Blockage (obstruction) of the the tube that carries urine from the kidney to the bladder (ureter).  Failure of the kidney stone to break into pieces (fragments). What happens before the procedure? Staying hydrated Follow instructions from your health care provider about hydration, which may include:  Up to 2 hours before the procedure - you may continue to drink clear liquids, such as water, clear fruit juice, black coffee, and plain tea. Eating and drinking restrictions Follow instructions from your health care provider about eating and drinking, which may include:  8 hours before the procedure - stop eating heavy meals or foods  such as meat, fried foods, or fatty foods.  6 hours before the procedure - stop eating light meals or foods, such as toast or cereal.  6 hours before the procedure - stop drinking milk or drinks that contain milk.  2 hours before the procedure - stop drinking clear liquids. General instructions  Plan to have someone take you home from the hospital or clinic.  Ask your health care provider about: ? Changing or stopping your regular medicines. This is especially important if you are taking diabetes medicines or blood thinners. ? Taking medicines such as aspirin and ibuprofen. These medicines and other NSAIDs can thin your blood. Do not take these medicines for 7 days before your procedure if your health care provider instructs you not to.  You may have tests, such as: ? Blood tests. ? Urine tests. ? Imaging tests, such as a CT scan. What happens during the procedure?  To lower your risk of infection: ? Your health care team will wash or sanitize their hands. ? Your skin will be washed with soap.  An IV tube will be inserted into one of your veins. This tube will give you fluids and medicines.  You will be given one or more of the following: ? A medicine to help you relax (sedative). ? A medicine to make you fall asleep (general anesthetic).  A water-filled cushion may be placed behind your kidney or on your abdomen. In some cases you may be placed in a tub of lukewarm water.  Your body will be positioned in a way that makes it easy to target the kidney   stone.  A flexible tube with holes in it (stent) may be placed in the ureter. This will help keep urine flowing from the kidney if the fragments of the stone have been blocking the ureter.  An X-ray or ultrasound exam will be done to locate your stone.  Shock waves will be aimed at the stone. If you are awake, you may feel a tapping sensation as the shock waves pass through your body. The procedure may vary among health care  providers and hospitals. What happens after the procedure?  You may have an X-ray to see whether the procedure was able to break up the kidney stone and how much of the stone has passed. If large stone fragments remain after treatment, you may need to have a second procedure at a later time.  Your blood pressure, heart rate, breathing rate, and blood oxygen level will be monitored until the medicines you were given have worn off.  You may be given antibiotics or pain medicine as needed.  If a stent was placed in your ureter during surgery, it may stay in place for a few weeks.  You may need strain your urine to collect pieces of the kidney stone for testing.  You will need to drink plenty of water.  Do not drive for 24 hours if you were given a sedative. Summary  Lithotripsy is a treatment that can sometimes help eliminate kidney stones and the pain that they cause.  A form of lithotripsy, also known as extracorporeal shock wave lithotripsy, is a nonsurgical procedure that crushes a kidney stone with shock waves.  Generally, this is a safe procedure. However, problems may occur, including damage to the kidney or other organs, infection, or obstruction of the tube that carries urine from the kidney to the bladder (ureter).  When you go home, you will need to drink plenty of water. You may be asked to strain your urine to collect pieces of the kidney stone for testing. This information is not intended to replace advice given to you by your health care provider. Make sure you discuss any questions you have with your health care provider. Document Released: 07/19/2000 Document Revised: 11/02/2018 Document Reviewed: 06/12/2016 Elsevier Patient Education  2020 Elsevier Inc.   Lithotripsy, Care After This sheet gives you information about how to care for yourself after your procedure. Your health care provider may also give you more specific instructions. If you have problems or questions,  contact your health care provider. What can I expect after the procedure? After the procedure, it is common to have:  Some blood in your urine. This should only last for a few days.  Soreness in your back, sides, or upper abdomen for a few days.  Blotches or bruises on your back where the pressure wave entered the skin.  Pain, discomfort, or nausea when pieces (fragments) of the kidney stone move through the tube that carries urine from the kidney to the bladder (ureter). Stone fragments may pass soon after the procedure, but they may continue to pass for up to 4-8 weeks. ? If you have severe pain or nausea, contact your health care provider. This may be caused by a large stone that was not broken up, and this may mean that you need more treatment.  Some pain or discomfort during urination.  Some pain or discomfort in the lower abdomen or (in men) at the base of the penis. Follow these instructions at home: Medicines  Take over-the-counter and prescription medicines only   as told by your health care provider.  If you were prescribed an antibiotic medicine, take it as told by your health care provider. Do not stop taking the antibiotic even if you start to feel better.  Do not drive for 24 hours if you were given a medicine to help you relax (sedative).  Do not drive or use heavy machinery while taking prescription pain medicine. Eating and drinking      Drink enough water and fluids to keep your urine clear or pale yellow. This helps any remaining pieces of the stone to pass. It can also help prevent new stones from forming.  Eat plenty of fresh fruits and vegetables.  Follow instructions from your health care provider about eating and drinking restrictions. You may be instructed: ? To reduce how much salt (sodium) you eat or drink. Check ingredients and nutrition facts on packaged foods and beverages. ? To reduce how much meat you eat.  Eat the recommended amount of calcium for  your age and gender. Ask your health care provider how much calcium you should have. General instructions  Get plenty of rest.  Most people can resume normal activities 1-2 days after the procedure. Ask your health care provider what activities are safe for you.  Your health care provider may direct you to lie in a certain position (postural drainage) and tap firmly (percuss) over your kidney area to help stone fragments pass. Follow instructions as told by your health care provider.  If directed, strain all urine through the strainer that was provided by your health care provider. ? Keep all fragments for your health care provider to see. Any stones that are found may be sent to a medical lab for examination. The stone may be as small as a grain of salt.  Keep all follow-up visits as told by your health care provider. This is important. Contact a health care provider if:  You have pain that is severe or does not get better with medicine.  You have nausea that is severe or does not go away.  You have blood in your urine longer than your health care provider told you to expect.  You have more blood in your urine.  You have pain during urination that does not go away.  You urinate more frequently than usual and this does not go away.  You develop a rash or any other possible signs of an allergic reaction. Get help right away if:  You have severe pain in your back, sides, or upper abdomen.  You have severe pain while urinating.  Your urine is very dark red.  You have blood in your stool (feces).  You cannot pass any urine at all.  You feel a strong urge to urinate after emptying your bladder.  You have a fever or chills.  You develop shortness of breath, difficulty breathing, or chest pain.  You have severe nausea that leads to persistent vomiting.  You faint. Summary  After this procedure, it is common to have some pain, discomfort, or nausea when pieces (fragments)  of the kidney stone move through the tube that carries urine from the kidney to the bladder (ureter). If this pain or nausea is severe, however, you should contact your health care provider.  Most people can resume normal activities 1-2 days after the procedure. Ask your health care provider what activities are safe for you.  Drink enough water and fluids to keep your urine clear or pale yellow. This helps any remaining   pieces of the stone to pass, and it can help prevent new stones from forming.  If directed, strain your urine and keep all fragments for your health care provider to see. Fragments or stones may be as small as a grain of salt.  Get help right away if you have severe pain in your back, sides, or upper abdomen or have severe pain while urinating. This information is not intended to replace advice given to you by your health care provider. Make sure you discuss any questions you have with your health care provider. Document Released: 08/11/2007 Document Revised: 11/02/2018 Document Reviewed: 06/12/2016 Elsevier Patient Education  2020 Elsevier Inc.  

## 2019-04-02 ENCOUNTER — Ambulatory Visit: Payer: 59

## 2019-04-05 ENCOUNTER — Other Ambulatory Visit: Payer: Self-pay

## 2019-04-05 ENCOUNTER — Other Ambulatory Visit
Admission: RE | Admit: 2019-04-05 | Discharge: 2019-04-05 | Disposition: A | Discharge status: Home / Self Care | Payer: 59 | Source: Ambulatory Visit | Attending: Urology | Admitting: Urology

## 2019-04-05 DIAGNOSIS — Z20828 Contact with and (suspected) exposure to other viral communicable diseases: Secondary | ICD-10-CM | POA: Diagnosis not present

## 2019-04-05 DIAGNOSIS — Z01812 Encounter for preprocedural laboratory examination: Secondary | ICD-10-CM | POA: Insufficient documentation

## 2019-04-05 LAB — SARS CORONAVIRUS 2 (TAT 6-24 HRS): SARS Coronavirus 2: NEGATIVE

## 2019-04-08 ENCOUNTER — Encounter
Admission: RE | Disposition: A | Discharge status: Home / Self Care | Payer: Self-pay | Source: Home / Self Care | Attending: Urology

## 2019-04-08 ENCOUNTER — Other Ambulatory Visit: Payer: Self-pay

## 2019-04-08 ENCOUNTER — Ambulatory Visit: Payer: 59

## 2019-04-08 ENCOUNTER — Encounter: Payer: Self-pay | Admitting: *Deleted

## 2019-04-08 ENCOUNTER — Ambulatory Visit
Admission: RE | Admit: 2019-04-08 | Discharge: 2019-04-08 | Disposition: A | Discharge status: Home / Self Care | Payer: 59 | Attending: Urology | Admitting: Urology

## 2019-04-08 DIAGNOSIS — Z841 Family history of disorders of kidney and ureter: Secondary | ICD-10-CM | POA: Insufficient documentation

## 2019-04-08 DIAGNOSIS — N201 Calculus of ureter: Secondary | ICD-10-CM | POA: Insufficient documentation

## 2019-04-08 DIAGNOSIS — F17210 Nicotine dependence, cigarettes, uncomplicated: Secondary | ICD-10-CM | POA: Diagnosis not present

## 2019-04-08 DIAGNOSIS — Z6841 Body Mass Index (BMI) 40.0 and over, adult: Secondary | ICD-10-CM | POA: Insufficient documentation

## 2019-04-08 DIAGNOSIS — N200 Calculus of kidney: Secondary | ICD-10-CM

## 2019-04-08 DIAGNOSIS — E669 Obesity, unspecified: Secondary | ICD-10-CM | POA: Diagnosis not present

## 2019-04-08 HISTORY — PX: EXTRACORPOREAL SHOCK WAVE LITHOTRIPSY: SHX1557

## 2019-04-08 LAB — POCT PREGNANCY, URINE: Preg Test, Ur: NEGATIVE

## 2019-04-08 SURGERY — LITHOTRIPSY, ESWL
Anesthesia: Moderate Sedation | Laterality: Left

## 2019-04-08 MED ORDER — CIPROFLOXACIN HCL 500 MG PO TABS
500.0000 mg | ORAL_TABLET | Freq: Once | ORAL | Status: AC
  Administered 2019-04-08: 500 mg via ORAL

## 2019-04-08 MED ORDER — SODIUM CHLORIDE 0.9 % IV SOLN
INTRAVENOUS | Status: DC
  Administered 2019-04-08: 07:00:00 via INTRAVENOUS

## 2019-04-08 MED ORDER — DIAZEPAM 5 MG PO TABS
ORAL_TABLET | ORAL | Status: AC
  Filled 2019-04-08: qty 2

## 2019-04-08 MED ORDER — DIAZEPAM 5 MG PO TABS
10.0000 mg | ORAL_TABLET | Freq: Once | ORAL | Status: AC
  Administered 2019-04-08: 10 mg via ORAL

## 2019-04-08 MED ORDER — ONDANSETRON HCL 4 MG/2ML IJ SOLN
4.0000 mg | Freq: Once | INTRAMUSCULAR | Status: AC
  Administered 2019-04-08: 4 mg via INTRAVENOUS

## 2019-04-08 MED ORDER — CIPROFLOXACIN HCL 500 MG PO TABS
ORAL_TABLET | ORAL | Status: AC
  Filled 2019-04-08: qty 1

## 2019-04-08 MED ORDER — TAMSULOSIN HCL 0.4 MG PO CAPS: 0.4000 mg | ORAL_CAPSULE | Freq: Every day | ORAL | 0 refills | Status: DC

## 2019-04-08 MED ORDER — DIPHENHYDRAMINE HCL 25 MG PO CAPS
25.0000 mg | ORAL_CAPSULE | Freq: Once | ORAL | Status: AC
  Administered 2019-04-08: 25 mg via ORAL

## 2019-04-08 MED ORDER — ONDANSETRON HCL 4 MG/2ML IJ SOLN
INTRAMUSCULAR | Status: AC
  Filled 2019-04-08: qty 2

## 2019-04-08 MED ORDER — DIPHENHYDRAMINE HCL 25 MG PO CAPS
ORAL_CAPSULE | ORAL | Status: AC
  Filled 2019-04-08: qty 1

## 2019-04-08 MED ORDER — HYDROCODONE-ACETAMINOPHEN 5-325 MG PO TABS: 1.0000 | ORAL_TABLET | ORAL | 0 refills | Status: AC | PRN

## 2019-04-08 NOTE — H&P (Signed)
UROLOGY H&P UPDATE  Agree with prior H&P dated 8/26. Left 49mm proximal ureteral stone with intermittent renal colic, 128SK, 81.3GIT, clearly seen on KUB this morning.  Cardiac: RRR Lungs: CTA bilaterally  Laterality: LEFT Procedure: Left SWL  Urine: 8/25  Informed consent obtained, we specifically discussed the risks of bleeding, infection, post-operative pain, need for additional procedures, steinstrasse, and possible need for additional procedures.  Billey Co, MD 04/08/2019

## 2019-04-22 ENCOUNTER — Ambulatory Visit
Admission: RE | Admit: 2019-04-22 | Discharge: 2019-04-22 | Disposition: A | Discharge status: Home / Self Care | Payer: 59 | Source: Ambulatory Visit | Attending: Urology | Admitting: Urology

## 2019-04-22 ENCOUNTER — Other Ambulatory Visit: Payer: Self-pay

## 2019-04-22 ENCOUNTER — Encounter: Payer: Self-pay | Admitting: Urology

## 2019-04-22 ENCOUNTER — Ambulatory Visit (INDEPENDENT_AMBULATORY_CARE_PROVIDER_SITE_OTHER): Payer: 59 | Admitting: Urology

## 2019-04-22 VITALS — BP 121/81 | HR 93 | Ht 63.0 in | Wt 249.0 lb

## 2019-04-22 DIAGNOSIS — N200 Calculus of kidney: Secondary | ICD-10-CM

## 2019-04-22 NOTE — Progress Notes (Signed)
   04/22/2019 11:22 AM   Loura Back 09/02/1975 [id number]  Reason for visit: Follow up LEFT SWL  HPI: I saw Dawn Terry back in urology clinic for follow-up after undergoing shockwave lithotripsies.  She is a 43 year old female who initially presented with a left 7 mm proximal ureteral stone and renal colic.  She underwent shockwave lithotripsy on 04/08/2019.  She reports resolution of her left-sided pain, and she passed a number of small fragments that she brought with her today.  I personally reviewed her KUB and do not appreciate any residual fragments.   ROS: Please see flowsheet from today's date for complete review of systems.  Physical Exam: BP 121/81   Pulse 93   Ht 5\' 3"  (1.6 m)   Wt 249 lb (112.9 kg)   BMI 44.11 kg/m     Assessment & Plan:   In summary, the patient is a 43 year old female status post successful left shockwave lithotripsy for a 7 mm proximal ureteral stone.  We discussed general stone prevention strategies including adequate hydration with goal of producing 2.5 L of urine daily, increasing citric acid intake, increasing calcium intake during high oxalate meals, minimizing animal protein, and decreasing salt intake. Information about dietary recommendations given today.   RTC 1 year for KUB Will call with stone analysis  A total of 15 minutes were spent face-to-face with the patient, greater than 50% was spent in patient education, counseling, and coordination of care regarding nephrolithiasis and stone prevention.  Billey Co, [facility] [address] [place], [place] [address] [phone]

## 2019-04-28 ENCOUNTER — Other Ambulatory Visit: Payer: Self-pay | Admitting: Urology

## 2019-04-29 ENCOUNTER — Telehealth: Payer: Self-pay

## 2019-04-29 NOTE — Telephone Encounter (Signed)
-----   Message from Billey Co, MD sent at 04/29/2019 10:38 AM EDT -----   ----- Message ----- From: Delon [place] D Sent: 04/28/2019   8:30 AM EDT To: Billey Co, MD

## 2019-04-29 NOTE — Telephone Encounter (Signed)
mychart notification sent  Her stone was calcium oxalate, the most common type of stone. Continue prevention strategies as discussed in clinic, and keep one year follow up.  Nickolas Madrid, MD 04/29/2019

## 2019-04-29 NOTE — Progress Notes (Signed)
Her stone was calcium oxalate, the most common type of stone. Continue prevention strategies as discussed in clinic, and keep one year follow up.  Nickolas Madrid, MD 04/29/2019

## 2019-10-06 ENCOUNTER — Ambulatory Visit: Payer: 59 | Attending: Internal Medicine

## 2019-10-06 DIAGNOSIS — Z20822 Contact with and (suspected) exposure to covid-19: Secondary | ICD-10-CM

## 2019-10-07 LAB — NOVEL CORONAVIRUS, NAA: SARS-CoV-2, NAA: NOT DETECTED

## 2020-04-20 ENCOUNTER — Ambulatory Visit
Admission: RE | Admit: 2020-04-20 | Discharge: 2020-04-20 | Disposition: A | Discharge status: Home / Self Care | Payer: Managed Care, Other (non HMO) | Attending: Urology | Admitting: Urology

## 2020-04-20 ENCOUNTER — Encounter: Payer: Self-pay | Admitting: Urology

## 2020-04-20 ENCOUNTER — Ambulatory Visit: Payer: Managed Care, Other (non HMO) | Admitting: Urology

## 2020-04-20 ENCOUNTER — Other Ambulatory Visit: Payer: Self-pay

## 2020-04-20 ENCOUNTER — Ambulatory Visit
Admission: RE | Admit: 2020-04-20 | Discharge: 2020-04-20 | Disposition: A | Discharge status: Home / Self Care | Payer: Managed Care, Other (non HMO) | Source: Ambulatory Visit | Attending: Urology | Admitting: Urology

## 2020-04-20 VITALS — BP 125/83 | HR 73 | Ht 62.0 in | Wt 244.0 lb

## 2020-04-20 DIAGNOSIS — N200 Calculus of kidney: Secondary | ICD-10-CM | POA: Insufficient documentation

## 2020-04-20 NOTE — Progress Notes (Signed)
   04/20/2020 2:35 PM   Dawn Terry Sep 14, 1975 [id number]  Reason for visit: Follow up nephrolithiasis  HPI: I saw Dawn Terry back in urology clinic for nephrolithiasis.  She is a 44 year old female who underwent uncomplicated left shockwave lithotripsy on 04/08/2019 for a 7 mm proximal ureteral stone, and post procedure KUB showed clearance of all fragments. Stone type was calcium oxalate  She denies any stone events or problems since we saw her last year.    I personally reviewed her KUB today that shows no evidence of nephrolithiasis.  We discussed general stone prevention strategies including adequate hydration with goal of producing 2.5 L of urine daily, increasing citric acid intake, increasing calcium intake during high oxalate meals, minimizing animal protein, and decreasing salt intake. Information about dietary recommendations given today.   Follow-up as needed  Sondra Come, MD  [facility] [address] [place], [place] [address] [phone]

## 2020-04-20 NOTE — Patient Instructions (Signed)
Dietary Guidelines to Help Prevent Kidney Stones Kidney stones are deposits of minerals and salts that form inside your kidneys. Your risk of developing kidney stones may be greater depending on your diet, your lifestyle, the medicines you take, and whether you have certain medical conditions. Most people can reduce their chances of developing kidney stones by following the instructions below. Depending on your overall health and the type of kidney stones you tend to develop, your dietitian may give you more specific instructions. What are tips for following this plan? Reading food labels  Choose foods with "no salt added" or "low-salt" labels. Limit your sodium intake to less than 1500 mg per day.  Choose foods with calcium for each meal and snack. Try to eat about 300 mg of calcium at each meal. Foods that contain 200-500 mg of calcium per serving include: ? 8 oz (237 ml) of milk, fortified nondairy milk, and fortified fruit juice. ? 8 oz (237 ml) of kefir, yogurt, and soy yogurt. ? 4 oz (118 ml) of tofu. ? 1 oz of cheese. ? 1 cup (300 g) of dried figs. ? 1 cup (91 g) of cooked broccoli. ? 1-3 oz can of sardines or mackerel.  Most people need 1000 to 1500 mg of calcium each day. Talk to your dietitian about how much calcium is recommended for you. Shopping  Buy plenty of fresh fruits and vegetables. Most people do not need to avoid fruits and vegetables, even if they contain nutrients that may contribute to kidney stones.  When shopping for convenience foods, choose: ? Whole pieces of fruit. ? Premade salads with dressing on the side. ? Low-fat fruit and yogurt smoothies.  Avoid buying frozen meals or prepared deli foods.  Look for foods with live cultures, such as yogurt and kefir. Cooking  Do not add salt to food when cooking. Place a salt shaker on the table and allow each person to add his or her own salt to taste.  Use vegetable protein, such as beans, textured vegetable  protein (TVP), or tofu instead of meat in pasta, casseroles, and soups. Meal planning   Eat less salt, if told by your dietitian. To do this: ? Avoid eating processed or premade food. ? Avoid eating fast food.  Eat less animal protein, including cheese, meat, poultry, or fish, if told by your dietitian. To do this: ? Limit the number of times you have meat, poultry, fish, or cheese each week. Eat a diet free of meat at least 2 days a week. ? Eat only one serving each day of meat, poultry, fish, or seafood. ? When you prepare animal protein, cut pieces into small portion sizes. For most meat and fish, one serving is about the size of one deck of cards.  Eat at least 5 servings of fresh fruits and vegetables each day. To do this: ? Keep fruits and vegetables on hand for snacks. ? Eat 1 piece of fruit or a handful of berries with breakfast. ? Have a salad and fruit at lunch. ? Have two kinds of vegetables at dinner.  Limit foods that are high in a substance called oxalate. These include: ? Spinach. ? Rhubarb. ? Beets. ? Potato chips and french fries. ? Nuts.  If you regularly take a diuretic medicine, make sure to eat at least 1-2 fruits or vegetables high in potassium each day. These include: ? Avocado. ? Banana. ? Orange, prune, carrot, or tomato juice. ? Baked potato. ? Cabbage. ? Beans and split   peas. General instructions   Drink enough fluid to keep your urine clear or pale yellow. This is the most important thing you can do.  Talk to your health care provider and dietitian about taking daily supplements. Depending on your health and the cause of your kidney stones, you may be advised: ? Not to take supplements with vitamin C. ? To take a calcium supplement. ? To take a daily probiotic supplement. ? To take other supplements such as magnesium, fish oil, or vitamin B6.  Take all medicines and supplements as told by your health care provider.  Limit alcohol intake to no  more than 1 drink a day for nonpregnant women and 2 drinks a day for men. One drink equals 12 oz of beer, 5 oz of wine, or 1 oz of hard liquor.  Lose weight if told by your health care provider. Work with your dietitian to find strategies and an eating plan that works best for you. What foods are not recommended? Limit your intake of the following foods, or as told by your dietitian. Talk to your dietitian about specific foods you should avoid based on the type of kidney stones and your overall health. Grains Breads. Bagels. Rolls. Baked goods. Salted crackers. Cereal. Pasta. Vegetables Spinach. Rhubarb. Beets. Canned vegetables. Pickles. Olives. Meats and other protein foods Nuts. Nut butters. Large portions of meat, poultry, or fish. Salted or cured meats. Deli meats. Hot dogs. Sausages. Dairy Cheese. Beverages Regular soft drinks. Regular vegetable juice. Seasonings and other foods Seasoning blends with salt. Salad dressings. Canned soups. Soy sauce. Ketchup. Barbecue sauce. Canned pasta sauce. Casseroles. Pizza. Lasagna. Frozen meals. Potato chips. French fries. Summary  You can reduce your risk of kidney stones by making changes to your diet.  The most important thing you can do is drink enough fluid. You should drink enough fluid to keep your urine clear or pale yellow.  Ask your health care provider or dietitian how much protein from animal sources you should eat each day, and also how much salt and calcium you should have each day. This information is not intended to replace advice given to you by your health care provider. Make sure you discuss any questions you have with your health care provider. Document Revised: 11/11/2018 Document Reviewed: 07/02/2016 [employer] Patient Education  2020 [employer]  

## 2021-06-30 IMAGING — CT CT RENAL STONE PROTOCOL
2 of 4 series · 16 of 46 positions shown, 18 images · non-contrast
Comparison: None.

CLINICAL DATA: Left flank pain, left lower quadrant pain

EXAM:
CT ABDOMEN AND PELVIS WITHOUT CONTRAST
TECHNIQUE: Multidetector CT imaging of the abdomen and pelvis was performed
following the standard protocol without IV contrast.

[Series 2: renal stone · axial · 0.73mm/px · z∈[-1513,-1058]mm · 13 of 99 slices shown, 15 images]
[im 4/99  soft-tissue]
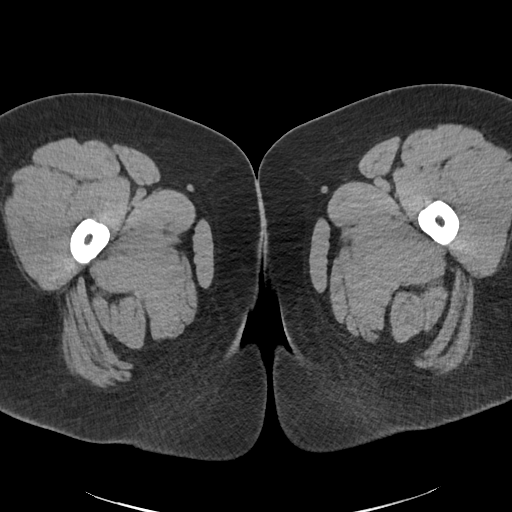
[im 4/99  bone]
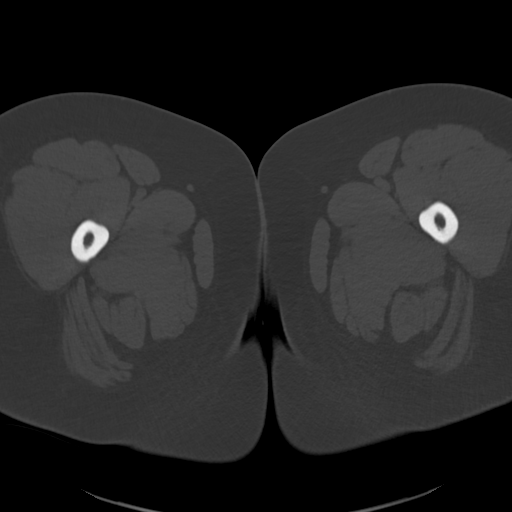
[im 12/99  soft-tissue]
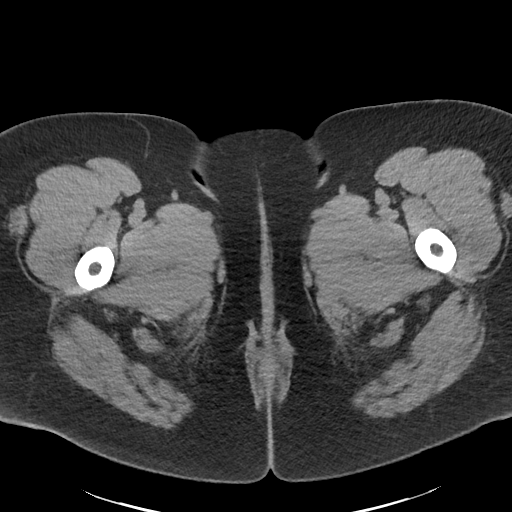
[im 20/99  soft-tissue]
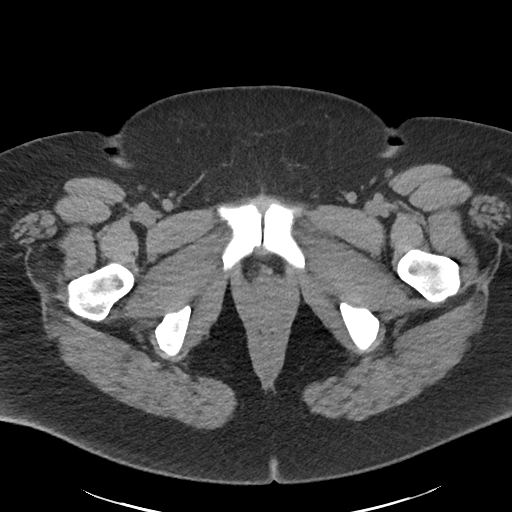
[im 28/99  soft-tissue]
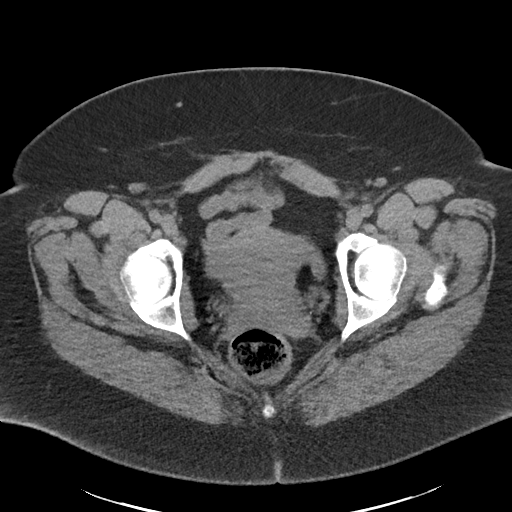
[im 36/99  soft-tissue]
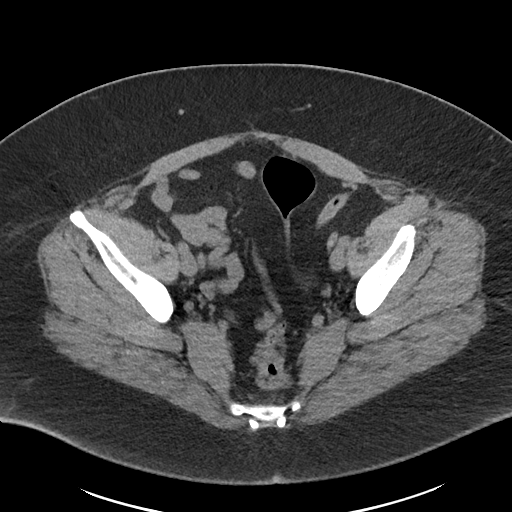
[im 44/99  soft-tissue]
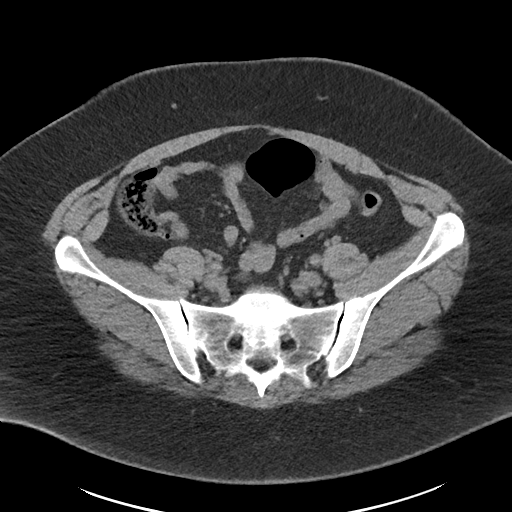
[im 51/99  soft-tissue]
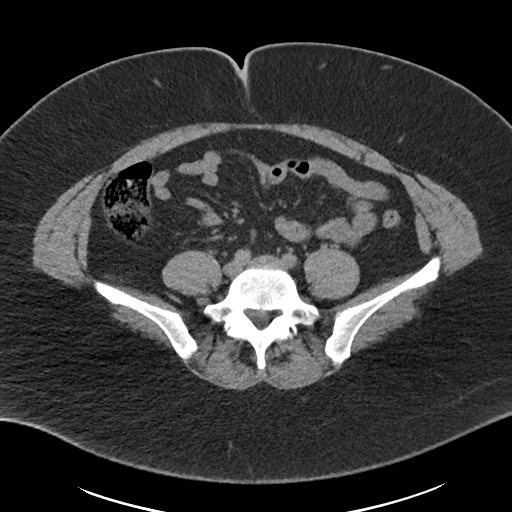
[im 55/99  soft-tissue]
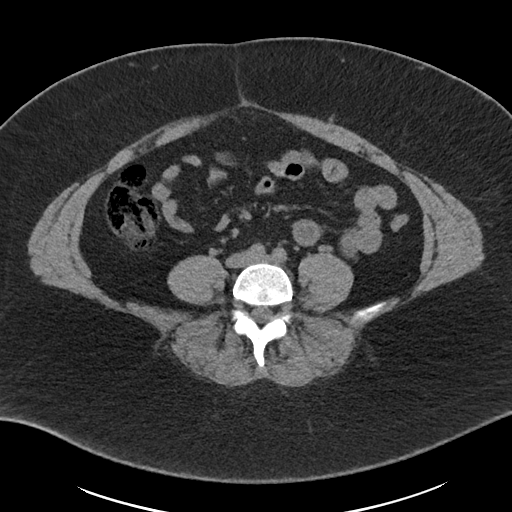
[im 63/99  soft-tissue]
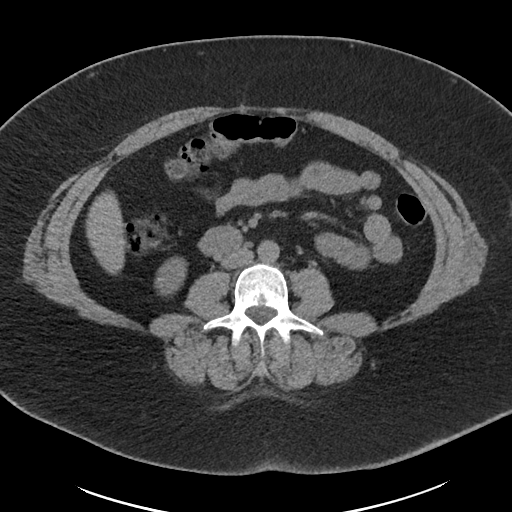
[im 63/99  bone]
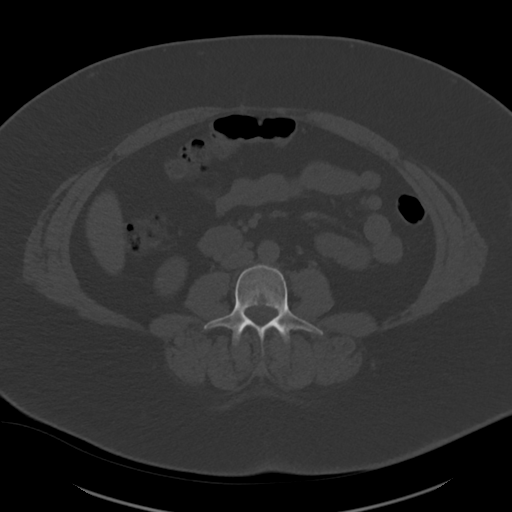
[im 71/99  soft-tissue]
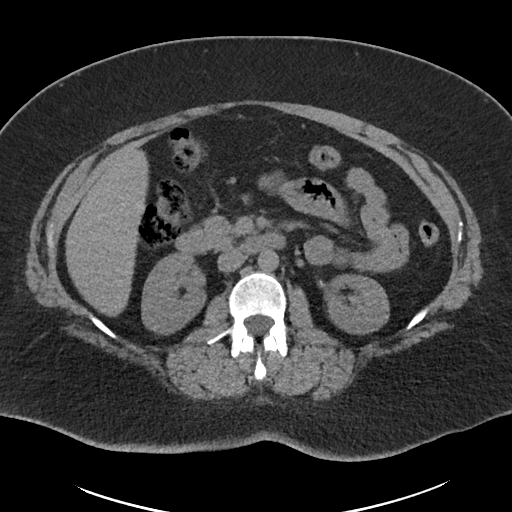
[im 79/99  soft-tissue]
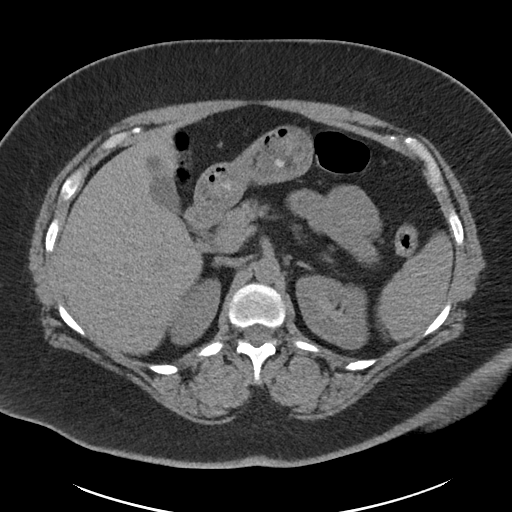
[im 87/99  soft-tissue]
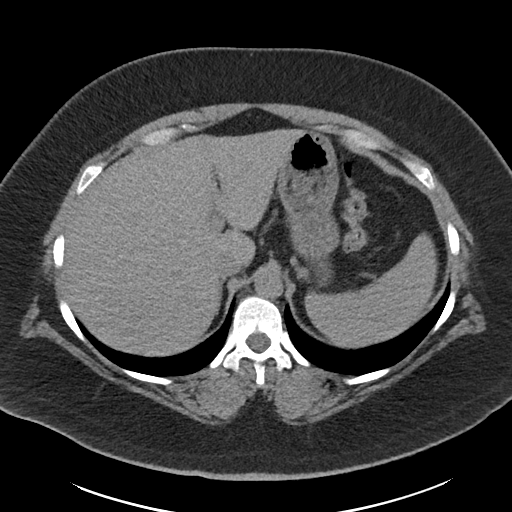
[im 95/99  soft-tissue]
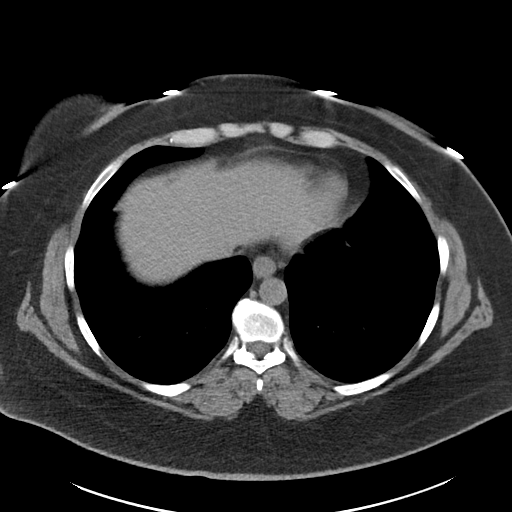

[Series 4: coronal renal stone · coronal · 0.73mm/px · 3 of 156 slices shown]
[im 52/156  soft-tissue]
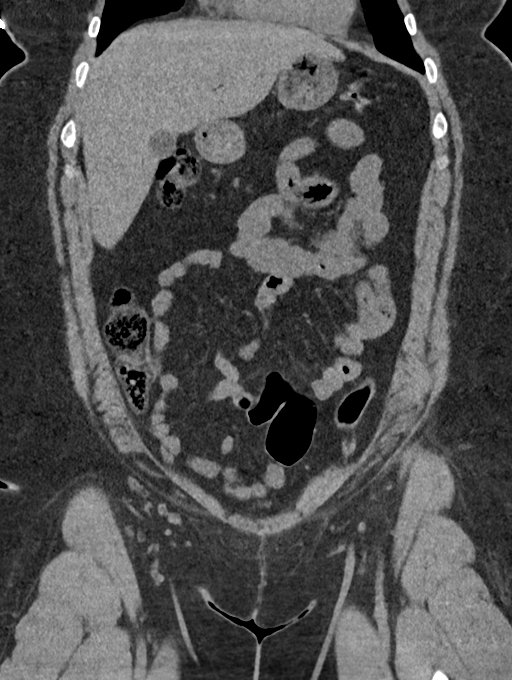
[im 69/156  soft-tissue]
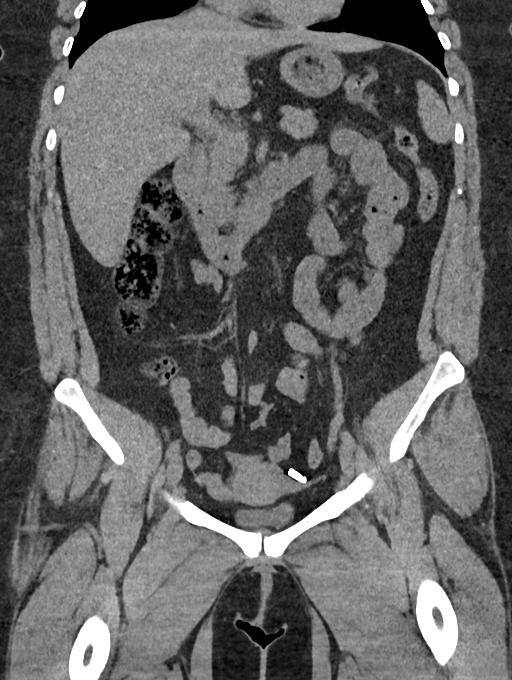
[im 87/156  soft-tissue]
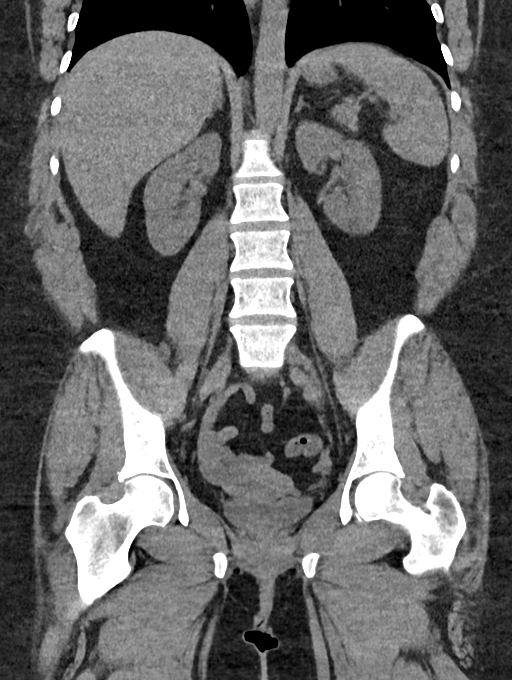

[16 of 46 positions shown; findings below may reference images not displayed]

FINDINGS: Lower chest: Lung bases are clear. No effusions. Heart is normal
size.

Hepatobiliary: No focal hepatic abnormality. Gallbladder
unremarkable.

Pancreas: No focal abnormality or ductal dilatation.

Spleen: No focal abnormality.  Normal size.

Adrenals/Urinary Tract: 6 mm proximal left ureteral stone. No
hydronephrosis. No additional ureteral stones. Adrenal glands and
urinary bladder unremarkable.

Stomach/Bowel: Normal appendix. Stomach, large and small bowel
grossly unremarkable.

Vascular/Lymphatic: No evidence of aneurysm or adenopathy.

Reproductive: Uterus and adnexa unremarkable.  No mass.

Other: No free fluid or free air.

Musculoskeletal: No acute bony abnormality.
IMPRESSION: 6 mm non-obstructing proximal left ureteral stone.

## 2021-07-22 IMAGING — CR DG ABDOMEN 1V
2 series · 2 of 2 positions shown · non-contrast
Comparison: Radiographs April 08, 2019. CT scan March 31, 2019.

CLINICAL DATA: Nephrolithiasis.

EXAM:
ABDOMEN - 1 VIEW

[abdomen kub (1 of 2)]
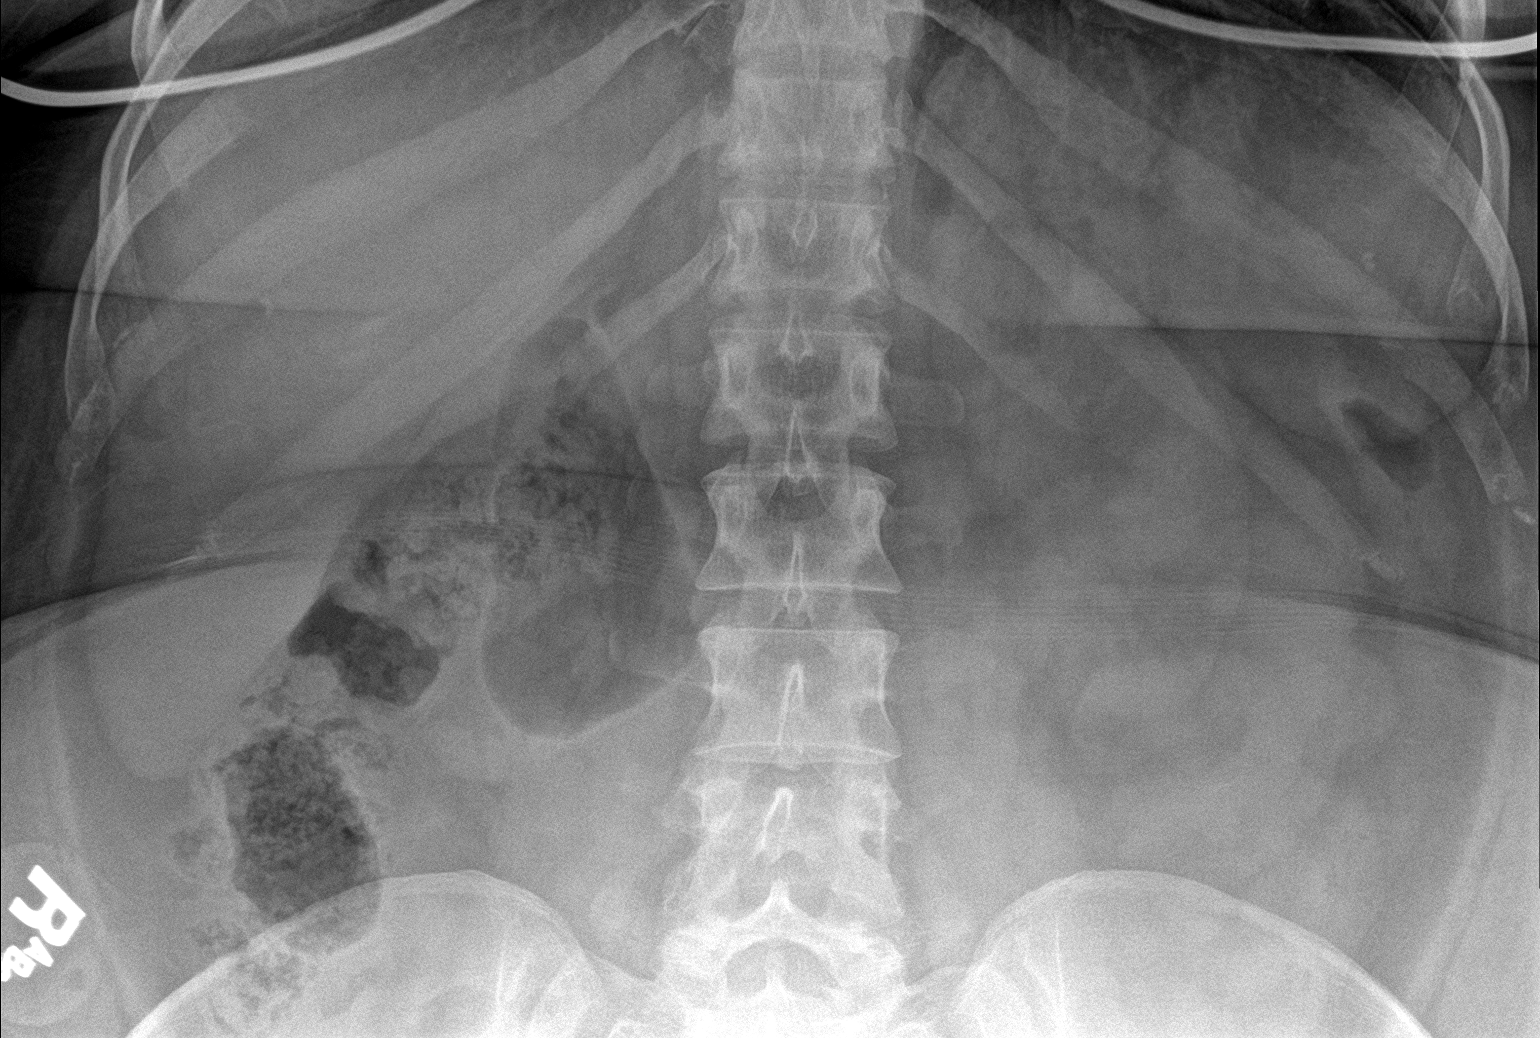

[abdomen kub (2 of 2)]
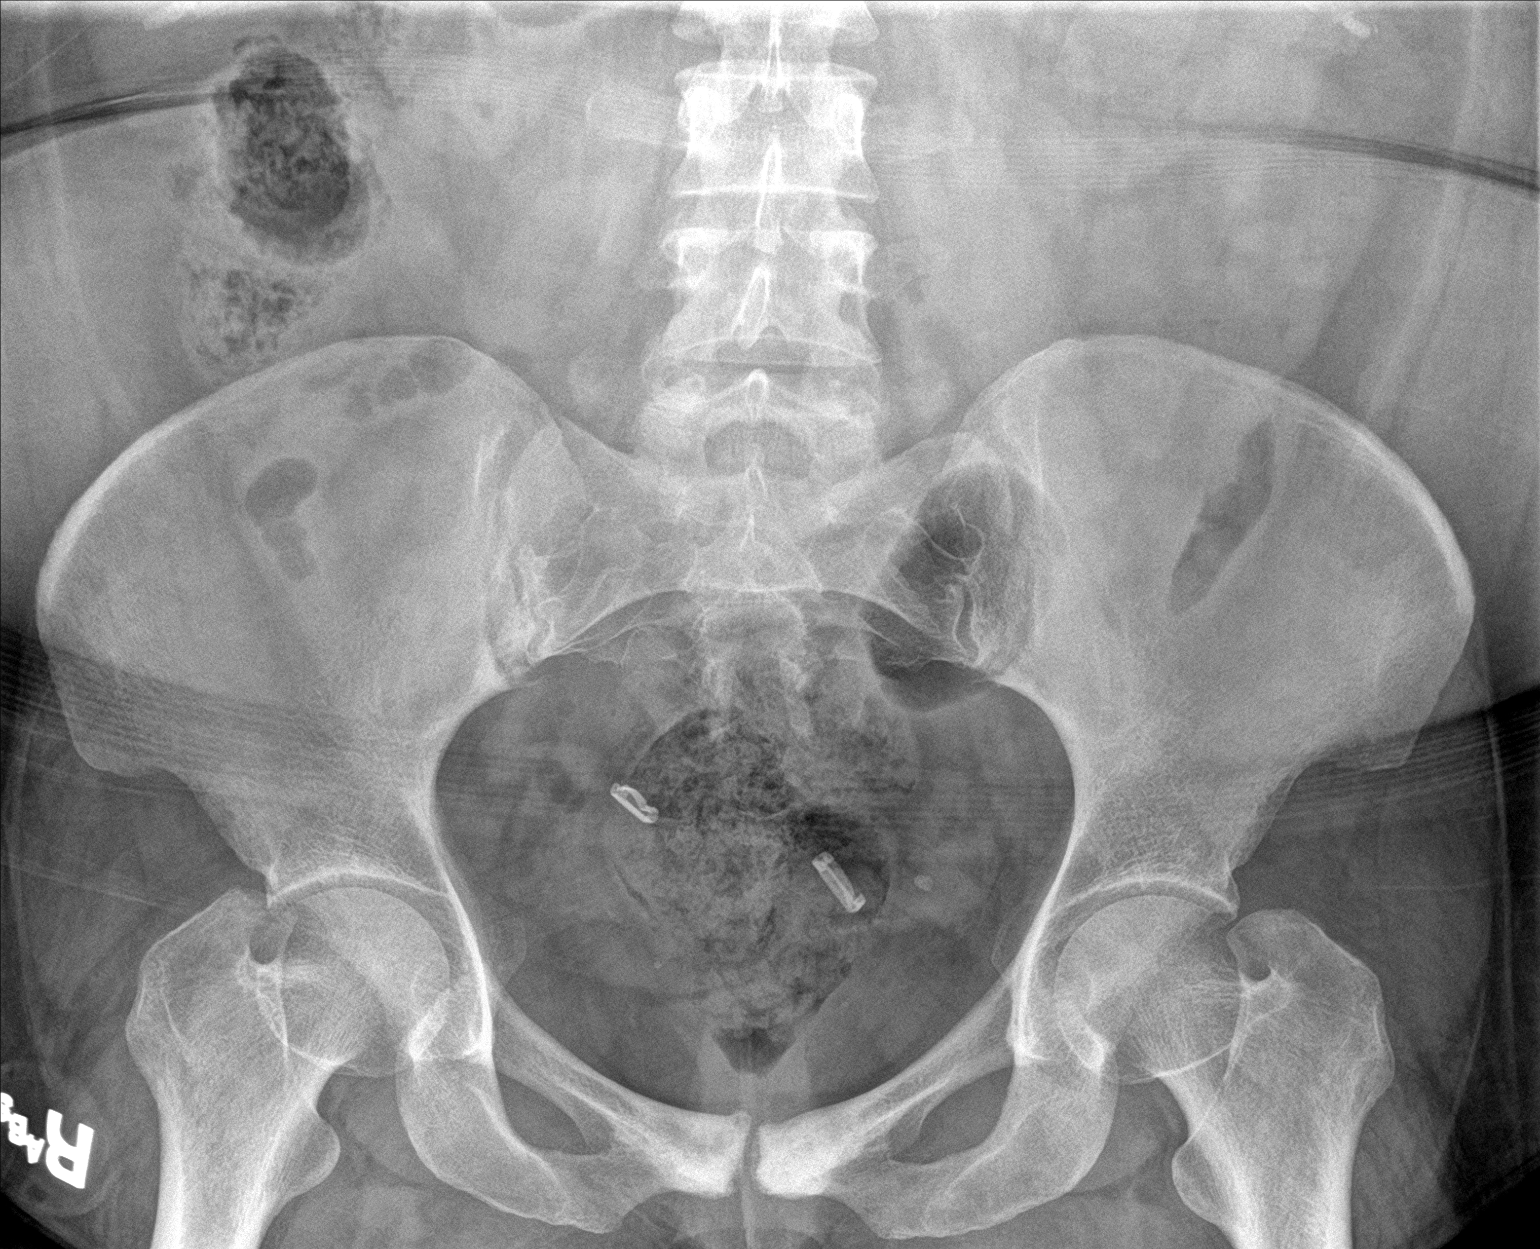

[2 of 2 positions shown; findings below may reference images not displayed]

FINDINGS: The bowel gas pattern is normal. No definite nephrolithiasis is
noted. Phlebolith is noted in left pelvis.
IMPRESSION: No definite nephrolithiasis.

## 2022-07-21 IMAGING — CR DG ABDOMEN 1V
1 series · 2 of 2 positions shown · non-contrast
Comparison: April 22, 2019.

CLINICAL DATA: Nephrolithiasis.

EXAM:
ABDOMEN - 1 VIEW

[Series 1: dg abd 1 view · 0.14mm/px · 2 of 2 slices shown]
[im 1/2]
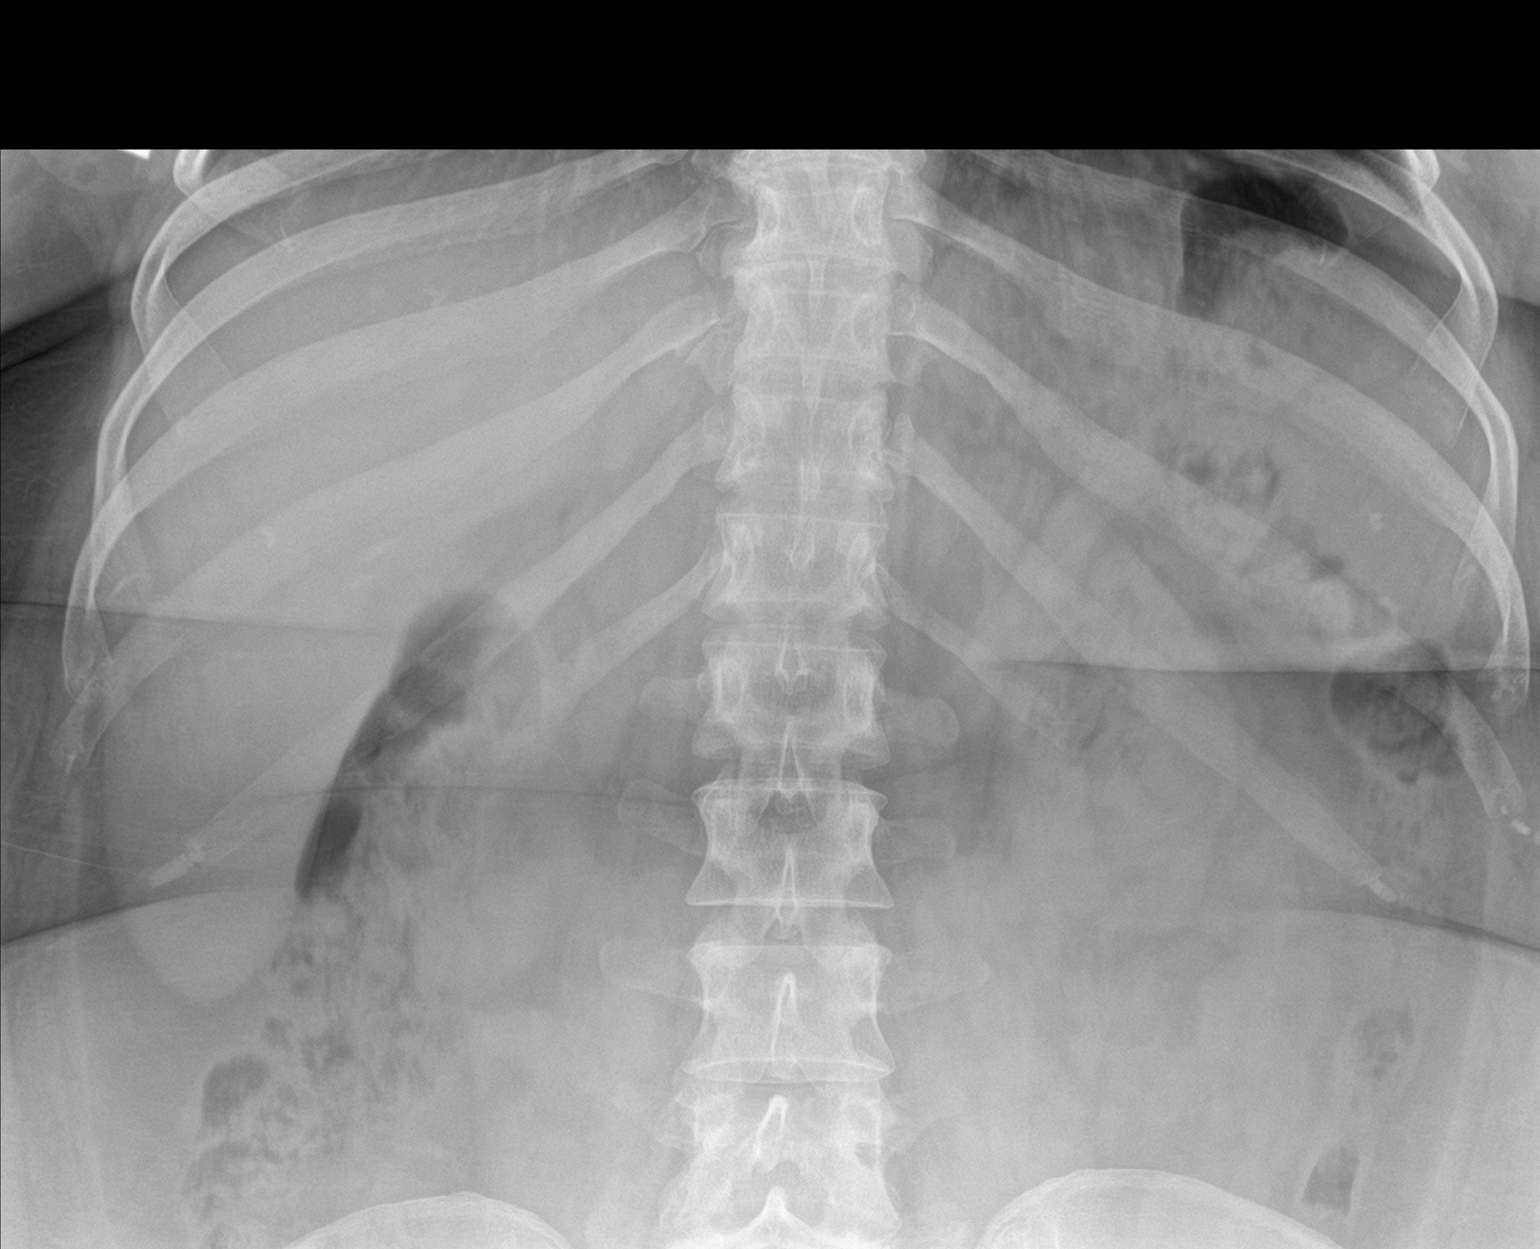
[im 2/2]
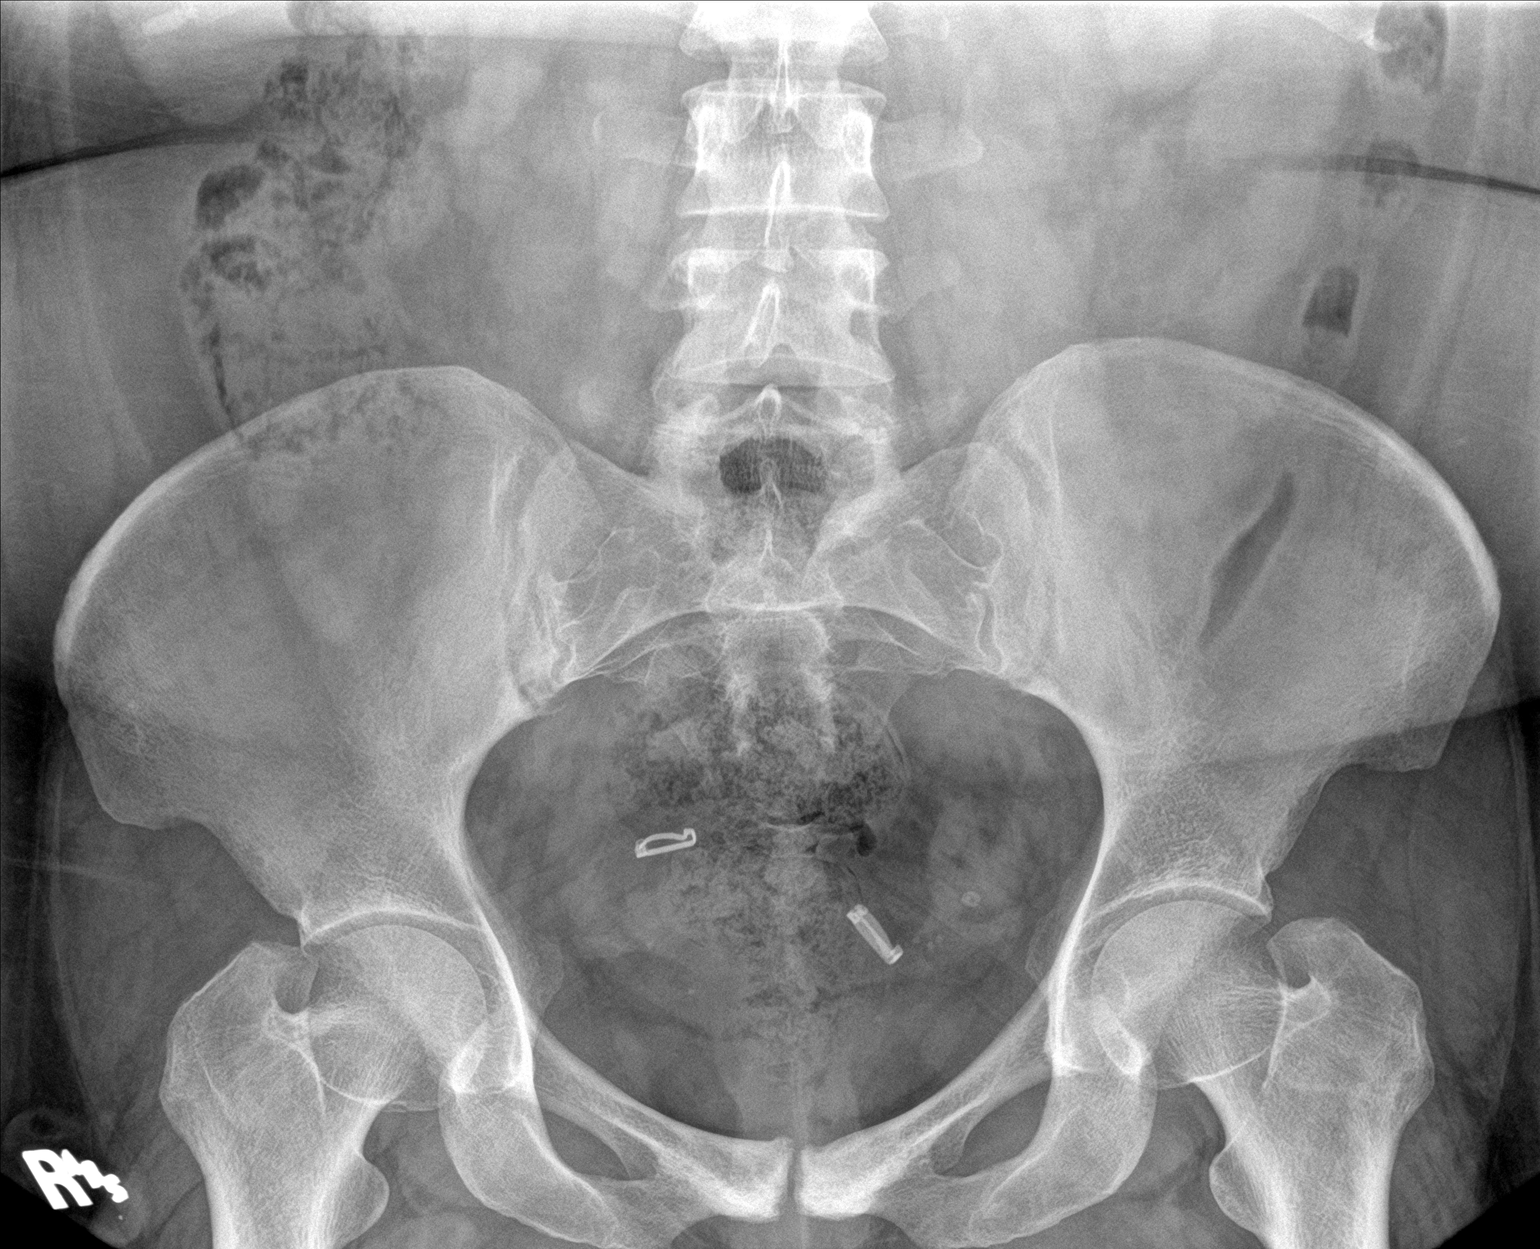

[2 of 2 positions shown; findings below may reference images not displayed]

FINDINGS: The bowel gas pattern is normal. No radio-opaque calculi or other
significant radiographic abnormality are seen.
IMPRESSION: Negative.
